# Patient Record
Sex: Female | Born: 1995 | Hispanic: Yes | Marital: Single | Smoking: Never smoker
Health system: Southern US, Community
[De-identification: ages and names within clinical notes are randomized; demographics above are authoritative.]

## PROBLEM LIST (undated history)

## (undated) DIAGNOSIS — R079 Chest pain, unspecified: Secondary | ICD-10-CM

## (undated) DIAGNOSIS — R0602 Shortness of breath: Secondary | ICD-10-CM

## (undated) DIAGNOSIS — N159 Renal tubulo-interstitial disease, unspecified: Secondary | ICD-10-CM

## (undated) DIAGNOSIS — R519 Headache, unspecified: Secondary | ICD-10-CM

## (undated) DIAGNOSIS — R000 Tachycardia, unspecified: Secondary | ICD-10-CM

## (undated) DIAGNOSIS — O24419 Gestational diabetes mellitus in pregnancy, unspecified control: Secondary | ICD-10-CM

## (undated) HISTORY — DX: Shortness of breath: R06.02

## (undated) HISTORY — DX: Headache, unspecified: R51.9

## (undated) HISTORY — DX: Tachycardia, unspecified: R00.0

## (undated) HISTORY — PX: NO PAST SURGERIES: SHX2092

## (undated) HISTORY — DX: Chest pain, unspecified: R07.9

---
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
## 2018-02-04 ENCOUNTER — Encounter (HOSPITAL_COMMUNITY): Payer: Self-pay | Admitting: Emergency Medicine

## 2018-02-04 ENCOUNTER — Other Ambulatory Visit: Payer: Self-pay

## 2018-02-04 ENCOUNTER — Emergency Department (HOSPITAL_COMMUNITY): Payer: Self-pay

## 2018-02-04 ENCOUNTER — Emergency Department (HOSPITAL_COMMUNITY)
Admission: EM | Admit: 2018-02-04 | Discharge: 2018-02-04 | Disposition: A | Discharge status: Home / Self Care | Payer: Self-pay | Attending: Emergency Medicine | Admitting: Emergency Medicine

## 2018-02-04 DIAGNOSIS — M25461 Effusion, right knee: Secondary | ICD-10-CM | POA: Insufficient documentation

## 2018-02-04 DIAGNOSIS — M25469 Effusion, unspecified knee: Secondary | ICD-10-CM

## 2018-02-04 MED ORDER — CYCLOBENZAPRINE HCL 10 MG PO TABS: 10.0000 mg | ORAL_TABLET | Freq: Two times a day (BID) | ORAL | 0 refills | Status: DC | PRN

## 2018-02-04 MED ORDER — IBUPROFEN 800 MG PO TABS
800.0000 mg | ORAL_TABLET | Freq: Once | ORAL | Status: AC
  Administered 2018-02-04: 800 mg via ORAL
  Filled 2018-02-04: qty 1

## 2018-02-04 MED ORDER — IBUPROFEN 600 MG PO TABS: 600.0000 mg | ORAL_TABLET | Freq: Four times a day (QID) | ORAL | 0 refills | Status: DC | PRN

## 2018-02-04 NOTE — ED Triage Notes (Signed)
Pt to ER for evaluation of left hip and femur pain after mechanical fall in her yard, reports can bear weight but it is painful. No deformity noted. Pt speaks spanish, interpreter utilized.

## 2018-02-04 NOTE — Discharge Instructions (Signed)
Please call and follow up with orthopedist for further management of your right knee injury.

## 2018-02-04 NOTE — ED Provider Notes (Signed)
[facility] EMERGENCY DEPARTMENT Provider Note   CSN: [id number] Arrival date & time: 02/04/18  1112     History   Chief Complaint Chief Complaint  Patient presents with  . Foot Pain    HPI Virginia Pearson is a 22 y.o. female.  The history is provided by the patient. No language interpreter was used.     22 year old female presenting complaining of right leg injury.  Patient states yesterday she slipped on wet ground and fell down.  She states she fell to the right side of her body and twisted her right leg.  She is now having pain throughout the leg most significant in her right knee radiates to her right thigh.  Pain is sharp throbbing persistent worsening with movement and of moderate to severe in intensity.  She is having difficulty bearing weight on her right leg.  No complaints of back pain ankle or foot pain.  She is not pregnant.  History reviewed. No pertinent past medical history.  There are no active problems to display for this patient.   History reviewed. No pertinent surgical history.   OB History   None      Home Medications    Prior to Admission medications   Not on File    Family History History reviewed. No pertinent family history.  Social History Social History   Tobacco Use  . Smoking status: Never Smoker  . Smokeless tobacco: Never Used  Substance Use Topics  . Alcohol use: No  . Drug use: No     Allergies   Patient has no known allergies.   Review of Systems Review of Systems  Constitutional: Negative for fever.  Skin: Negative for wound.  Neurological: Negative for numbness.     Physical Exam Updated Vital Signs BP 136/61 (BP Location: Left Arm)   Pulse 80   Temp 98.4 F (36.9 C) (Oral)   Resp 16   LMP 01/29/2018   SpO2 100%   Physical Exam  Constitutional: She appears well-developed and well-nourished. No distress.  HENT:  Head: Atraumatic.  Eyes: Conjunctivae are normal.  Neck: Neck  supple.  Abdominal: Soft. There is no tenderness.  Musculoskeletal: She exhibits tenderness (Right knee: Exquisite tenderness to the palpation of the anterior knee with decreased flexion extension but no obvious bruising swelling or crepitus noted.  Tenderness along right anterior mid thigh on palpation.  No tenderness to right hip or right ankle.).  Neurological: She is alert.  Skin: No rash noted.  Psychiatric: She has a normal mood and affect.  Nursing note and vitals reviewed.    ED Treatments / Results  Labs (all labs ordered are listed, but only abnormal results are displayed) Labs Reviewed - No data to display  EKG None  Radiology Dg Knee Complete 4 Views Right  Result Date: 02/04/2018 CLINICAL DATA:  Mechanical fall in the yard. Painful to bear weight. Symptoms are centered just above the patella. EXAM: RIGHT KNEE - COMPLETE 4+ VIEW COMPARISON:  None. FINDINGS: The bones are subjectively adequately mineralized. There is a moderate-sized suprapatellar effusion. There is no acute fracture or dislocation. The joint spaces are well maintained. IMPRESSION: Moderate-sized suprapatellar effusion. No acute fracture or dislocation. Electronically Signed   By: David  [place] M.D.   On: 02/04/2018 12:39    Procedures Procedures (including critical care time)  Medications Ordered in ED Medications  ibuprofen (ADVIL,MOTRIN) tablet 800 mg (800 mg Oral Given 02/04/18 1257)     Initial Impression /  Assessment and Plan / ED Course  I have reviewed the triage vital signs and the nursing notes.  Pertinent labs & imaging results that were available during my care of the patient were reviewed by me and considered in my medical decision making (see chart for details).     BP 136/61 (BP Location: Left Arm)   Pulse 80   Temp 98.4 F (36.9 C) (Oral)   Resp 16   LMP 01/29/2018   SpO2 100%    Final Clinical Impressions(s) / ED Diagnoses   Final diagnoses:  Suprapatellar effusion of  knee    ED Discharge Orders         Ordered    ibuprofen (ADVIL,MOTRIN) 600 MG tablet  Every 6 hours PRN     02/04/18 1315    cyclobenzaprine (FLEXERIL) 10 MG tablet  2 times daily PRN     02/04/18 1315         12:03 PM Patient fell yesterday and twisted her right leg.  Pain was significant to her right knee on gentle palpation.  Will obtain x-ray, pain medication given.  1:13 PM X-ray of right knee demonstrate moderate sized suprapatellar effusion without any acute fracture or dislocation.  Patient will be placed in a knee immobilizer and crutches.  Rice therapy discussed.  Orthopedic referral given.  Work note provided.  She is neurovascularly intact.  Return precautions discussed.   Fayrene Helper, PA-C 02/04/18 1317    Melene Plan, DO 02/04/18 1348

## 2018-05-08 NOTE — L&D Delivery Note (Signed)
OB/[facility]N Faculty [facility] Delivery Note  Virginia Pearson is a 23 y.o. G2P1001 s/p NSVD at [redacted]w[redacted]d. She was admitted for spontaneous labor.   ROM: 1h 26m with meconium fluid GBS Status: Negative   Maximum Maternal Temperature: 98.9 F   Labor Progress: . Patient arrived at 6 cm dilation and progressed spontaneously to full dilation.   Delivery Date/Time: 02/26/2019 at 0521 Delivery: Called to room and patient was complete and pushing. Head delivered in LOA position. No nuchal cord present. Shoulder and body delivered in usual fashion. Infant with weak spontaneous cry, placed on mother's abdomen, dried and stimulated. Cord clamped x 2 after 1-minute delay, and cut by father. Taken to warmer for several minutes but then returned to maternal abdomen. Cord blood drawn. Placenta delivered spontaneously with gentle cord traction. Fundus firm with massage and Pitocin. Labia, perineum, vagina, and cervix inspected with no lacerations.   Placenta: 3v, intact, to L&D Complications: none Lacerations: none EBL: 24 cc Analgesia: none   Infant: APGAR (1 MIN): 6   APGAR (5 MINS): 9    Weight: 3566 grams  Augustin Coupe, MD/[facility] Fellow, Faculty Practice

## 2018-05-09 ENCOUNTER — Emergency Department (HOSPITAL_COMMUNITY)
Admission: EM | Admit: 2018-05-09 | Discharge: 2018-05-09 | Disposition: A | Discharge status: Home / Self Care | Payer: Self-pay | Attending: Emergency Medicine | Admitting: Emergency Medicine

## 2018-05-09 ENCOUNTER — Emergency Department (HOSPITAL_COMMUNITY): Payer: Self-pay

## 2018-05-09 ENCOUNTER — Other Ambulatory Visit: Payer: Self-pay

## 2018-05-09 ENCOUNTER — Encounter (HOSPITAL_COMMUNITY): Payer: Self-pay | Admitting: Emergency Medicine

## 2018-05-09 DIAGNOSIS — Z79899 Other long term (current) drug therapy: Secondary | ICD-10-CM | POA: Insufficient documentation

## 2018-05-09 DIAGNOSIS — M545 Low back pain, unspecified: Secondary | ICD-10-CM

## 2018-05-09 LAB — URINALYSIS, ROUTINE W REFLEX MICROSCOPIC
Bilirubin Urine: NEGATIVE
Glucose, UA: NEGATIVE mg/dL
HGB URINE DIPSTICK: NEGATIVE
Ketones, ur: NEGATIVE mg/dL
Nitrite: NEGATIVE
PROTEIN: NEGATIVE mg/dL
Specific Gravity, Urine: 1.014 (ref 1.005–1.030)
pH: 6 (ref 5.0–8.0)

## 2018-05-09 LAB — PREGNANCY, URINE: PREG TEST UR: NEGATIVE

## 2018-05-09 MED ORDER — CYCLOBENZAPRINE HCL 10 MG PO TABS: 10.0000 mg | ORAL_TABLET | Freq: Two times a day (BID) | ORAL | 0 refills | Status: DC | PRN

## 2018-05-09 NOTE — Discharge Instructions (Signed)
1. Medications: Alternate 600 mg of ibuprofen and [phone] mg of Tylenol every 3 hours as needed for pain. Do not exceed 4000 mg of Tylenol daily.  Take ibuprofen with food to avoid upset stomach issues.  You can take Flexeril as needed for muscle spasm up to twice daily but do not drive, drink alcohol, or operate heavy machinery while taking this medicine because it may make you drowsy.  I typically recommend taking this medicine only at night when you are going to sleep.  He can also cut these tablets in half if they make you feel very drowsy. 2. Treatment: rest, drink plenty of fluids, gentle stretching as discussed (see attached), alternate ice and heat (or stick with whichever feels best) 20 minutes on 20 minutes off. 3. Follow Up: Please followup with your primary doctor in 3-7 days for discussion of your diagnoses and further evaluation after today's visit; if you do not have a primary care doctor use the resource guide provided to find one;  Return to the ER for worsening back pain, difficulty walking, loss of bowel or bladder control or other concerning symptoms

## 2018-05-09 NOTE — ED Triage Notes (Signed)
Pt reports flank pain and right sided abdominal pain x 2 weeks, it is intermittent, denies any urinary symptoms.

## 2018-05-09 NOTE — ED Notes (Signed)
Patient transported to CT 

## 2018-05-09 NOTE — ED Notes (Signed)
Pt aware that a urine sample is needed and will provide one when able. 

## 2018-05-09 NOTE — ED Provider Notes (Signed)
[facility] EMERGENCY DEPARTMENT Provider Note   CSN: [id number] Arrival date & time: 05/09/18  1844     History   Chief Complaint Chief Complaint  Patient presents with  . Flank Pain    HPI Virginia Pearson is a 23 y.o. female presents for evaluation of acute onset, intermittent right flank pain for 2 weeks.  She notes stabbing pain to the right flank intermittently which will radiate down as well as urinary frequency.  She denies dysuria, hematuria, nausea, vomiting, or fevers.  She has not tried anything for her symptoms.  She states that she is prone to "kidney infections "due to "an abnormally shaped kidney ".  Denies history of nephrolithiasis.  Pain worsens bending forward or backwards.  Reports her symptoms feel very consistent with prior episodes of pyelonephritis.  Denies trauma or fall to the back.  No bowel or bladder incontinence, saddle anesthesia, or history of IV drug use.  Denies vaginal itching, bleeding, or discharge.   The history is provided by the patient.    History reviewed. No pertinent past medical history.  There are no active problems to display for this patient.   History reviewed. No pertinent surgical history.   OB History   No obstetric history on file.      Home Medications    Prior to Admission medications   Medication Sig Start Date End Date Taking? Authorizing Provider  cyclobenzaprine (FLEXERIL) 10 MG tablet Take 1 tablet (10 mg total) by mouth 2 (two) times daily as needed for muscle spasms. 05/09/18   Galaxy Borden A, PA-C  ibuprofen (ADVIL,MOTRIN) 600 MG tablet Take 1 tablet (600 mg total) by mouth every 6 (six) hours as needed. 02/04/18   Fayrene Helper, PA-C    Family History History reviewed. No pertinent family history.  Social History Social History   Tobacco Use  . Smoking status: Never Smoker  . Smokeless tobacco: Never Used  Substance Use Topics  . Alcohol use: No  . Drug use: No     Allergies     Patient has no known allergies.   Review of Systems Review of Systems  Constitutional: Negative for chills and fever.  Respiratory: Negative for shortness of breath.   Cardiovascular: Negative for chest pain.  Gastrointestinal: Negative for abdominal pain, nausea and vomiting.  Genitourinary: Positive for flank pain and frequency. Negative for dysuria, hematuria and urgency.  Musculoskeletal: Positive for back pain.  All other systems reviewed and are negative.    Physical Exam Updated Vital Signs BP (!) 142/91 (BP Location: Right Arm)   Pulse (!) 104   Temp 98.3 F (36.8 C) (Oral)   Resp 16   Ht 5' (1.524 m)   Wt 81.6 kg   LMP 04/30/2018 (Exact Date)   SpO2 100%   BMI 35.15 kg/m   Physical Exam Vitals signs and nursing note reviewed.  Constitutional:      General: She is not in acute distress.    Appearance: She is well-developed.     Comments: Resting comfortably in bed  HENT:     Head: Normocephalic and atraumatic.  Eyes:     General:        Right eye: No discharge.        Left eye: No discharge.     Conjunctiva/sclera: Conjunctivae normal.  Neck:     Vascular: No JVD.     Trachea: No tracheal deviation.  Cardiovascular:     Rate and Rhythm: Normal rate.  Pulmonary:  Effort: Pulmonary effort is normal.     Breath sounds: Normal breath sounds.  Abdominal:     General: Bowel sounds are normal. There is no distension.     Tenderness: There is no abdominal tenderness. There is right CVA tenderness. There is no guarding.  Musculoskeletal:     Comments: No midline spine TTP, right upper paralumbar muscle tenderness, no deformity, crepitus, or step-off noted.  5/5 strength of BLE major muscle groups.   Skin:    General: Skin is warm and dry.     Findings: No erythema.  Neurological:     Mental Status: She is alert.     Comments: Fluent speech, no facial droop, sensation intact to soft touch of bilateral lower extremities.  Ambulatory without difficulty,  exhibits good balance.  Psychiatric:        Behavior: Behavior normal.      ED Treatments / Results  Labs (all labs ordered are listed, but only abnormal results are displayed) Labs Reviewed  URINALYSIS, ROUTINE W REFLEX MICROSCOPIC - Abnormal; Notable for the following components:      Result Value   Leukocytes, UA TRACE (*)    Bacteria, UA RARE (*)    All other components within normal limits  PREGNANCY, URINE    EKG None  Radiology Ct Renal Stone Study  Result Date: 05/09/2018 CLINICAL DATA:  Left-sided back and flank pain EXAM: CT ABDOMEN AND PELVIS WITHOUT CONTRAST TECHNIQUE: Multidetector CT imaging of the abdomen and pelvis was performed following the standard protocol without IV contrast. COMPARISON:  None. FINDINGS: Lower chest: Lung bases demonstrate no acute consolidation or effusion. Heart size is normal Hepatobiliary: No focal liver abnormality is seen. No gallstones, gallbladder wall thickening, or biliary dilatation. Pancreas: Unremarkable. No pancreatic ductal dilatation or surrounding inflammatory changes. Spleen: Normal in size without focal abnormality. Adrenals/Urinary Tract: Adrenal glands are within normal limits. Horseshoe kidney. Possible 1 cm cyst in the left moiety. No hydronephrosis. No ureteral stone. Bladder normal Stomach/Bowel: Stomach is within normal limits. Appendix appears normal. No evidence of bowel wall thickening, distention, or inflammatory changes. Vascular/Lymphatic: No significant vascular findings are present. No enlarged abdominal or pelvic lymph nodes. Reproductive: Uterus and bilateral adnexa are unremarkable. Other: Negative for free air or free fluid Musculoskeletal: No acute or significant osseous findings. IMPRESSION: 1. Horseshoe kidney.  Negative for hydronephrosis or ureteral stone 2. No CT evidence for acute intra-abdominal or pelvic abnormality Electronically Signed   By: Jasmine PangKim  Fujinaga M.D.   On: 05/09/2018 22:15     Procedures Procedures (including critical care time)  Medications Ordered in ED Medications - No data to display   Initial Impression / Assessment and Plan / ED Course  I have reviewed the triage vital signs and the nursing notes.  Pertinent labs & imaging results that were available during my care of the patient were reviewed by me and considered in my medical decision making (see chart for details).     Patient presenting for evaluation of intermittent right low back pain.  Reports it feels similar to prior episodes of pyelonephritis.  She is afebrile, initially mildly tachycardic with improvement on reevaluation.  She is nontoxic in appearance. Pain reproducible on palpation. Her only urinary symptom is frequency.  Her UA is unremarkable.  No red flag signs concerning for cauda equina or spinal abscess.  Doubt dissection.  Ambulatory without difficulty.  CT renal stone study shows no evidence of nephrolithiasis, pyelonephritis, or renal abscess.  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  Will treat for possible musculoskeletal back pain with Flexeril, conservative therapy with ice, heat, gentle stretching, NSAIDs, and Tylenol.  Recommend follow-up with PCP if symptoms persist.  Discussed strict ED return precautions.  Patient and friend verbalized understanding of and agreement with plan and patient stable for discharge home at this time.  Final Clinical Impressions(s) / ED Diagnoses   Final diagnoses:  Acute right-sided low back pain without sciatica    ED Discharge Orders         Ordered    cyclobenzaprine (FLEXERIL) 10 MG tablet  2 times daily PRN     05/09/18 2235           Jeanie SewerFawze, Jelina Paulsen A, PA-C 05/09/18 2320    Rolan BuccoBelfi, Melanie, MD 05/09/18 2326

## 2018-05-09 NOTE — ED Notes (Signed)
Discharge instructions and prescriptions discussed with Pt. Pt verbalized understanding. Pt stable and ambulatory.   

## 2018-08-26 LAB — OB RESULTS CONSOLE HEPATITIS B SURFACE ANTIGEN: Hepatitis B Surface Ag: NEGATIVE

## 2018-08-26 LAB — OB RESULTS CONSOLE GC/CHLAMYDIA
Chlamydia: NEGATIVE
Gonorrhea: NEGATIVE

## 2018-08-26 LAB — OB RESULTS CONSOLE ABO/RH: RH Type: POSITIVE

## 2018-08-26 LAB — OB RESULTS CONSOLE HIV ANTIBODY (ROUTINE TESTING): HIV: NONREACTIVE

## 2018-08-26 LAB — OB RESULTS CONSOLE RUBELLA ANTIBODY, IGM: Rubella: IMMUNE

## 2019-02-26 ENCOUNTER — Inpatient Hospital Stay (HOSPITAL_COMMUNITY)
Admission: AD | Admit: 2019-02-26 | Discharge: 2019-02-27 | DRG: 807 | Disposition: A | Discharge status: Home / Self Care | Payer: Medicaid Other | Attending: Family Medicine | Admitting: Family Medicine

## 2019-02-26 ENCOUNTER — Other Ambulatory Visit: Payer: Self-pay

## 2019-02-26 ENCOUNTER — Encounter (HOSPITAL_COMMUNITY): Payer: Self-pay

## 2019-02-26 DIAGNOSIS — Z3A38 38 weeks gestation of pregnancy: Secondary | ICD-10-CM

## 2019-02-26 DIAGNOSIS — D573 Sickle-cell trait: Secondary | ICD-10-CM | POA: Diagnosis present

## 2019-02-26 DIAGNOSIS — Z3689 Encounter for other specified antenatal screening: Secondary | ICD-10-CM

## 2019-02-26 DIAGNOSIS — Z20828 Contact with and (suspected) exposure to other viral communicable diseases: Secondary | ICD-10-CM | POA: Diagnosis present

## 2019-02-26 DIAGNOSIS — O26893 Other specified pregnancy related conditions, third trimester: Secondary | ICD-10-CM | POA: Diagnosis present

## 2019-02-26 DIAGNOSIS — O9902 Anemia complicating childbirth: Secondary | ICD-10-CM | POA: Diagnosis present

## 2019-02-26 LAB — CBC
HCT: 34.7 % — ABNORMAL LOW (ref 36.0–46.0)
Hemoglobin: 11.7 g/dL — ABNORMAL LOW (ref 12.0–15.0)
MCH: 25.2 pg — ABNORMAL LOW (ref 26.0–34.0)
MCHC: 33.7 g/dL (ref 30.0–36.0)
MCV: 74.6 fL — ABNORMAL LOW (ref 80.0–100.0)
Platelets: 305 10*3/uL (ref 150–400)
RBC: 4.65 MIL/uL (ref 3.87–5.11)
RDW: 16.2 % — ABNORMAL HIGH (ref 11.5–15.5)
WBC: 11.1 10*3/uL — ABNORMAL HIGH (ref 4.0–10.5)
nRBC: 0 % (ref 0.0–0.2)

## 2019-02-26 LAB — TYPE AND SCREEN
ABO/RH(D): B POS
Antibody Screen: NEGATIVE

## 2019-02-26 LAB — RPR: RPR Ser Ql: NONREACTIVE

## 2019-02-26 LAB — ABO/RH: ABO/RH(D): B POS

## 2019-02-26 LAB — SARS CORONAVIRUS 2 BY RT PCR (HOSPITAL ORDER, PERFORMED IN ~~LOC~~ HOSPITAL LAB): SARS Coronavirus 2: NEGATIVE

## 2019-02-26 MED ORDER — LACTATED RINGERS IV SOLN
INTRAVENOUS | Status: DC
  Administered 2019-02-26: 05:00:00 via INTRAVENOUS

## 2019-02-26 MED ORDER — ACETAMINOPHEN 325 MG PO TABS: 650.0000 mg | ORAL_TABLET | ORAL | Status: DC | PRN

## 2019-02-26 MED ORDER — FLEET ENEMA 7-19 GM/118ML RE ENEM: 1.0000 | ENEMA | RECTAL | Status: DC | PRN

## 2019-02-26 MED ORDER — OXYTOCIN BOLUS FROM INFUSION
500.0000 mL | Freq: Once | INTRAVENOUS | Status: AC
  Administered 2019-02-26: 500 mL via INTRAVENOUS

## 2019-02-26 MED ORDER — DIPHENHYDRAMINE HCL 25 MG PO CAPS: 25.0000 mg | ORAL_CAPSULE | Freq: Four times a day (QID) | ORAL | Status: DC | PRN

## 2019-02-26 MED ORDER — ONDANSETRON HCL 4 MG PO TABS: 4.0000 mg | ORAL_TABLET | ORAL | Status: DC | PRN

## 2019-02-26 MED ORDER — BENZOCAINE-MENTHOL 20-0.5 % EX AERO: 1.0000 "application " | INHALATION_SPRAY | CUTANEOUS | Status: DC | PRN

## 2019-02-26 MED ORDER — COCONUT OIL OIL
1.0000 "application " | TOPICAL_OIL | Status: DC | PRN
  Administered 2019-02-27: 1 via TOPICAL

## 2019-02-26 MED ORDER — FENTANYL CITRATE (PF) 100 MCG/2ML IJ SOLN
100.0000 ug | Freq: Once | INTRAMUSCULAR | Status: AC
  Administered 2019-02-26: 50 ug via INTRAVENOUS

## 2019-02-26 MED ORDER — ACETAMINOPHEN 325 MG PO TABS
650.0000 mg | ORAL_TABLET | ORAL | Status: DC | PRN
  Administered 2019-02-26: 650 mg via ORAL
  Filled 2019-02-26: qty 2

## 2019-02-26 MED ORDER — ONDANSETRON HCL 4 MG/2ML IJ SOLN: 4.0000 mg | INTRAMUSCULAR | Status: DC | PRN

## 2019-02-26 MED ORDER — LIDOCAINE HCL (PF) 1 % IJ SOLN: 30.0000 mL | INTRAMUSCULAR | Status: DC | PRN

## 2019-02-26 MED ORDER — LACTATED RINGERS IV SOLN
500.0000 mL | INTRAVENOUS | Status: DC | PRN
  Administered 2019-02-26: 500 mL via INTRAVENOUS

## 2019-02-26 MED ORDER — SENNOSIDES-DOCUSATE SODIUM 8.6-50 MG PO TABS
2.0000 | ORAL_TABLET | ORAL | Status: DC
  Administered 2019-02-26: 23:00:00 2 via ORAL
  Filled 2019-02-26: qty 2

## 2019-02-26 MED ORDER — OXYTOCIN 40 UNITS IN NORMAL SALINE INFUSION - SIMPLE MED
2.5000 [IU]/h | INTRAVENOUS | Status: DC
  Filled 2019-02-26: qty 1000

## 2019-02-26 MED ORDER — DIBUCAINE (PERIANAL) 1 % EX OINT: 1.0000 "application " | TOPICAL_OINTMENT | CUTANEOUS | Status: DC | PRN

## 2019-02-26 MED ORDER — OXYCODONE HCL 5 MG PO TABS: 10.0000 mg | ORAL_TABLET | ORAL | Status: DC | PRN

## 2019-02-26 MED ORDER — OXYCODONE-ACETAMINOPHEN 5-325 MG PO TABS
1.0000 | ORAL_TABLET | ORAL | Status: DC | PRN
  Filled 2019-02-26: qty 1

## 2019-02-26 MED ORDER — ONDANSETRON HCL 4 MG/2ML IJ SOLN: 4.0000 mg | Freq: Four times a day (QID) | INTRAMUSCULAR | Status: DC | PRN

## 2019-02-26 MED ORDER — WITCH HAZEL-GLYCERIN EX PADS: 1.0000 "application " | MEDICATED_PAD | CUTANEOUS | Status: DC | PRN

## 2019-02-26 MED ORDER — OXYCODONE-ACETAMINOPHEN 5-325 MG PO TABS
2.0000 | ORAL_TABLET | ORAL | Status: DC | PRN
  Administered 2019-02-26: 1 via ORAL

## 2019-02-26 MED ORDER — MAGNESIUM HYDROXIDE 400 MG/5ML PO SUSP: 30.0000 mL | ORAL | Status: DC | PRN

## 2019-02-26 MED ORDER — IBUPROFEN 600 MG PO TABS
600.0000 mg | ORAL_TABLET | Freq: Four times a day (QID) | ORAL | Status: DC
  Administered 2019-02-26 – 2019-02-27 (×5): 600 mg via ORAL
  Filled 2019-02-26 (×5): qty 1

## 2019-02-26 MED ORDER — SOD CITRATE-CITRIC ACID 500-334 MG/5ML PO SOLN: 30.0000 mL | ORAL | Status: DC | PRN

## 2019-02-26 MED ORDER — FENTANYL CITRATE (PF) 100 MCG/2ML IJ SOLN
INTRAMUSCULAR | Status: AC
  Filled 2019-02-26: qty 2

## 2019-02-26 MED ORDER — PRENATAL MULTIVITAMIN CH
1.0000 | ORAL_TABLET | Freq: Every day | ORAL | Status: DC
  Administered 2019-02-26 – 2019-02-27 (×2): 1 via ORAL
  Filled 2019-02-26 (×2): qty 1

## 2019-02-26 MED ORDER — OXYCODONE HCL 5 MG PO TABS: 5.0000 mg | ORAL_TABLET | ORAL | Status: DC | PRN

## 2019-02-26 MED ORDER — SIMETHICONE 80 MG PO CHEW: 80.0000 mg | CHEWABLE_TABLET | ORAL | Status: DC | PRN

## 2019-02-26 NOTE — Discharge Summary (Signed)
Postpartum Discharge Summary    Patient Name: Virginia Pearson DOB: 04-05-1996 MRN: [id number]  Date of admission: 02/26/2019 Delivering Provider: Clarnce Flock   Date of discharge: 02/27/2019  Admitting diagnosis: 63.4WKS CTX Intrauterine pregnancy: [redacted]w[redacted]d    Secondary diagnosis:  Active Problems:   Normal labor  Additional problems:      Discharge diagnosis: Term Pregnancy Delivered                                                                                                Post partum procedures:None  Augmentation: None  Complications: None  Hospital course:  Onset of Labor With Vaginal Delivery     23y.o. yo G2P1001 at 370w1das admitted in Active Labor on 02/26/2019. Patient had an uncomplicated labor course as follows: progressed spontaneously to full dilation. Membrane Rupture Time/Date: 4:20 AM ,02/26/2019   Intrapartum Procedures: Episiotomy: None [1]                                         Lacerations:  None [1]  Patient had a delivery of a Viable infant. 02/26/2019  Information for the patient's newborn:  CoShatasha, Lambing0[[id number]]Delivery Method: Vag-Spont     Patient had an uncomplicated postpartum course.   During her precipitous labor she had two elevated BP's 40 minutes apart and subsequently had normal BP's. She is ambulating, tolerating a regular diet, passing flatus, and urinating well. Patient is discharged home in stable condition on 02/27/19.  Delivery time: 5:21 AM    Magnesium Sulfate received: No BMZ received: No Rhophylac:N/A MMR:N/A Transfusion:No  Physical exam  Vitals:   02/26/19 1345 02/26/19 1730 02/26/19 2013 02/27/19 0503  BP: 123/73 123/71 128/61 (!) 98/53  Pulse: 72 70 64 69  Resp: 18 16 18 17   Temp: 98.4 F (36.9 C) 99 F (37.2 C) 98.6 F (37 C) 98.2 F (36.8 C)  TempSrc: Oral Oral Oral Oral  SpO2: 100% 100% 100% 100%  Weight:      Height:       General: alert, cooperative and no  distress Lochia: appropriate Uterine Fundus: firm Incision: N/A DVT Evaluation: No evidence of DVT seen on physical exam. No significant calf/ankle edema. Labs: Lab Results  Component Value Date   WBC 11.1 (H) 02/26/2019   HGB 11.7 (L) 02/26/2019   HCT 34.7 (L) 02/26/2019   MCV 74.6 (L) 02/26/2019   PLT 305 02/26/2019   CMP Latest Ref Rng & Units 01/02/2013  Glucose 70 - 99 mg/dL 116(H)  BUN 6 - 23 mg/dL 11  Creatinine 0.47 - 1.00 mg/dL 0.60  Sodium 135 - 145 mEq/L 136  Potassium 3.5 - 5.1 mEq/L 3.8  Chloride 96 - 112 mEq/L 100  CO2 19 - 32 mEq/L 22  Calcium 8.4 - 10.5 mg/dL 10.1  Total Protein 6.0 - 8.3 g/dL 8.8(H)  Total Bilirubin 0.3 - 1.2 mg/dL 0.7  Alkaline Phos 50 - 162 U/L 170(H)  AST 0 - 37 U/L 17  ALT 0 -  35 U/L 12    Discharge instruction: per After Visit Summary and "Baby and Me Booklet".  After visit meds:  Allergies as of 02/27/2019   No Known Allergies     Medication List    STOP taking these medications   cyclobenzaprine 10 MG tablet Commonly known as: FLEXERIL     TAKE these medications   acetaminophen 500 MG tablet Commonly known as: TYLENOL Take 500 mg by mouth every 6 (six) hours as needed for mild pain.   ibuprofen 600 MG tablet Commonly known as: ADVIL Take 1 tablet (600 mg total) by mouth every 6 (six) hours as needed.   PNV Prenatal Plus Multivitamin 27-1 MG Tabs Take 1 tablet by mouth daily.       Diet: routine diet  Activity: Advance as tolerated. Pelvic rest for 6 weeks.   Outpatient follow up:6 weeks Follow up Appt:No future appointments. Follow up Visit:   Please schedule this patient for Postpartum visit in: 6 weeks with the following provider: Any provider For C/S patients schedule nurse incision check in weeks 2 weeks: no Low risk pregnancy complicated by: n/a Delivery mode:  SVD Anticipated Birth Control:  OCPs PP Procedures needed: none  Schedule Integrated BH visit: no      Newborn Data: Live born female   Birth Weight:  3566 grams APGAR: 73, 9  Newborn Delivery   Birth date/time: 02/26/2019 05:21:00 Delivery type: Vaginal, Spontaneous      Baby Feeding: Breast Disposition:home with mother   02/27/2019 Clarnce Flock, MD

## 2019-02-26 NOTE — Lactation Note (Signed)
This note was copied from a baby's chart. Lactation Consultation Note  Patient Name: Boy Julina Altmann [id number] Date: 02/26/2019 Reason for consult: Initial assessment;Early term 37-38.6wks  LC in to visit with P2 Mom of ET infant at 72 hrs old.  Mom encouraged to keep baby STS as much as possible.  Mom got up to use the bathroom, large meconium stool changed and placed baby STS on Mom's chest.    Mom desires to breastfeed longer than she did with first baby (7 months).  Mom states baby latched on after birth and fed well for 30 mins.  Normal newborn sleepiness reviewed.   Reviewed breast massage and hand expression.   Encouraged Mom to offer breast with any cues.  Encouraged Mom to ask for help prn.  Lactation brochure left in room.  Mom aware of IP and OP lactation support available to her.    Consult Status Consult Status: Follow-up Date: 02/27/19 Follow-up type: In-patient    Broadus John 02/26/2019, 12:43 PM

## 2019-02-26 NOTE — MAU Note (Signed)
Contractions often. Was 1 cm at 36 week check-up.  Started leaking in the car but not sure if I'm just peeing.  No bleeding. Last labor was 3 hours.

## 2019-02-26 NOTE — Progress Notes (Signed)
Patient bit left arm/writst while pushing/ delivering. Left a mark noted by RN and MD.   Virginia Mire, RN

## 2019-02-26 NOTE — H&P (Signed)
LABOR AND DELIVERY ADMISSION HISTORY AND PHYSICAL NOTE  Virginia Pearson is a 23 y.o. female G2P1001 with IUP at [redacted]w[redacted]d by 15 wk [place] not c/w LMP presenting for spontaneous labor.   Arrived to MAU 6cm and then had SROM with meconium during transport to labor and delivery.  On arrival she reports positive fetal movement. She endorses leakage of fluid, denies VB.  She plans on breast feeding. She requests OCP's for birth control.  Reports last birth was NSVD, no complications.   Prenatal History/Complications: PNC at [place]:  @[redacted]w[redacted]d , CWD, normal anatomy, cephalic presentation, 21%HYQ, EFW 299 grams  Pregnancy complications:  - sickle cell trait - L kidney smaller, decreased function? Per [facility] records  Past Medical History: No past medical history on file.  Past Surgical History: No past surgical history on file.  Obstetrical History: OB History    Gravida  2   Para  1   Term  1   Preterm      AB      Living  1     SAB      TAB      Ectopic      Multiple      Live Births  1           Social History: Social History   Socioeconomic History  . Marital status: Single    Spouse name: Not on file  . Number of children: Not on file  . Years of education: Not on file  . Highest education level: Not on file  Occupational History  . Not on file  Social Needs  . Financial resource strain: Not on file  . Food insecurity    Worry: Not on file    Inability: Not on file  . Transportation needs    Medical: Not on file    Non-medical: Not on file  Tobacco Use  . Smoking status: Never Smoker  . Smokeless tobacco: Never Used  Substance and Sexual Activity  . Alcohol use: No  . Drug use: No  . Sexual activity: Never  Lifestyle  . Physical activity    Days per week: Not on file    Minutes per session: Not on file  . Stress: Not on file  Relationships  . Social Herbalist on phone: Not on file    Gets together: Not on file    Attends  religious service: Not on file    Active member of club or organization: Not on file    Attends meetings of clubs or organizations: Not on file    Relationship status: Not on file  Other Topics Concern  . Not on file  Social History Narrative  . Not on file    Family History: No family history on file.  Allergies: No Known Allergies  Medications Prior to Admission  Medication Sig Dispense Refill Last Dose  . cyclobenzaprine (FLEXERIL) 10 MG tablet Take 1 tablet (10 mg total) by mouth 2 (two) times daily as needed for muscle spasms. 10 tablet 0   . ibuprofen (ADVIL,MOTRIN) 600 MG tablet Take 1 tablet (600 mg total) by mouth every 6 (six) hours as needed. 30 tablet 0      Review of Systems  All systems reviewed and negative except as stated in HPI  Physical Exam Blood pressure (!) 150/90, pulse 93, temperature 98.9 F (37.2 C), temperature source Oral, resp. rate (!) 21, last menstrual period 04/30/2018. General appearance: alert, oriented, NAD Lungs: normal respiratory  effort Heart: regular rate Abdomen: soft, non-tender; gravid, leopolds 3600 g Extremities: No calf swelling or tenderness Presentation: cephalic by exam Fetal monitoringBaseline: 150 bpm, Variability: Good {> 6 bpm), Accelerations: Reactive and Decelerations: mix of early and variables Uterine activityFrequency: Every 2-4 minutes Dilation: 6 Effacement (%): 100 Station: 0 Exam by:: Crissie Reese, MD  Prenatal labs: ABO, Rh:   B+ Antibody:  Neg Rubella:   Immune RPR:   Neg HBsAg:   Neg HIV:   NR GC/Chlamydia: neg/neg 02/10/2019  GBS:   Neg 02/10/2019 2-hr GTT: normal initial and 3rd tri one hour Genetic screening:  Neg quad screen Anatomy US: normal  Prenatal Transfer Tool  Maternal Diabetes: No Genetic Screening: Normal Maternal Ultrasounds/Referrals: Normal Fetal Ultrasounds or other Referrals:  None Maternal Substance Abuse:  No Significant Maternal Medications:  None Significant Maternal Lab  Results: Group B Strep negative  No results found for this or any previous visit (from the past 24 hour(s)).  Patient Active Problem List   Diagnosis Date Noted  . Normal labor 02/26/2019    Assessment: Virginia Pearson is a 23 y.o. G2P1001 at [redacted]w[redacted]d here for spontaneous labor.  #Labor: Arrives at 6cm with meconium, adequate contraction pattern, expectant management for time being, augment with pitocin PRN.  #Pain: IV pain medicine PRN, declines epidural #FWB: Cat II for variables however overall reassuring with moderate variability and accels #GBS/ID:  negative #COVID: swab pending #MOF: Breast #MOC: OCP's #Circ:  no  Venora Maples 02/26/2019, 4:35 AM

## 2019-02-26 NOTE — MAU Provider Note (Signed)
Chief Complaint:  No chief complaint on file.      HPI: Virginia Pearson is a 23 y.o. G1P0 at 6w4dwho presents to maternity admissions reporting contractions. . She reports good fetal movement, denies LOF, vaginal bleeding, vaginal itching/burning, urinary symptoms, h/a, dizziness, n/v, diarrhea, constipation or fever/chills.     Past Medical History: No past medical history on file.  Past obstetric history: OB History  Gravida Para Term Preterm AB Living  1            SAB TAB Ectopic Multiple Live Births               # Outcome Date GA Lbr Len/2nd Weight Sex Delivery Anes PTL Lv  1 Current             Past Surgical History: No past surgical history on file.  Family History: No family history on file.  Social History: Social History   Tobacco Use  . Smoking status: Never Smoker  . Smokeless tobacco: Never Used  Substance Use Topics  . Alcohol use: No  . Drug use: No    Allergies: No Known Allergies  Meds:  Medications Prior to Admission  Medication Sig Dispense Refill Last Dose  . cyclobenzaprine (FLEXERIL) 10 MG tablet Take 1 tablet (10 mg total) by mouth 2 (two) times daily as needed for muscle spasms. 10 tablet 0   . ibuprofen (ADVIL,MOTRIN) 600 MG tablet Take 1 tablet (600 mg total) by mouth every 6 (six) hours as needed. 30 tablet 0     I have reviewed patient's Past Medical Hx, Surgical Hx, Family Hx, Social Hx, medications and allergies.   ROS:  Review of Systems  Gastrointestinal: Positive for abdominal pain.  Genitourinary: Positive for pelvic pain. Negative for vaginal bleeding.   Other systems negative  Physical Exam    Dilation: 6.5 Effacement (%): 100 Cervical Position: Middle Station: -2 Presentation: Vertex Exam by:: benji Production designer, theatre/television/film  FHT:  Baseline 140 , moderate variability, accelerations present, no decelerations Contractions: q 2 mins Irregular     Labs: No results found for this or any previous visit (from the past 24  hour(s)).    Imaging:  Pt informed that the ultrasound is considered a limited OB ultrasound and is not intended to be a complete ultrasound exam.  Patient also informed that the ultrasound is not being completed with the intent of assessing for fetal or placental anomalies or any pelvic abnormalities.  Explained that the purpose of today's ultrasound is to assess for presentation, BPP and amniotic fluid volume.  Patient acknowledges the purpose of the exam and the limitations of the study.    Vertex confirmed by bedside US  MAU Course/MDM:  Assessment:  Plan: Admit  Hansel Feinstein CNM, MSN Certified Nurse-Midwife 02/26/2019 4:15 AM

## 2019-02-26 NOTE — Progress Notes (Signed)
Spanish interpreter [phone] at bedside along with provider Dr. Iona Coach, RN

## 2019-02-27 NOTE — Discharge Instructions (Signed)

## 2019-02-27 NOTE — Progress Notes (Signed)
[employer], present for discharge teaching for mother and baby.

## 2019-02-27 NOTE — Lactation Note (Signed)
This note was copied from a baby's chart. Lactation Consultation Note  Patient Name: Virginia Pearson [id number] Date: 02/27/2019 Reason for consult: Follow-up assessment;Early term 37-38.6wks;Infant weight loss  28 hours old ETI who is being exclusively BF by his mother, she's a P2. Mom and baby are going home today, baby is at 6% weight loss. Reviewed discharge instructions, engorgement prevention/treatment, treatment/prevention of sore nipples and red flags on when to call baby's pediatrician.  Mom complained of sore nipples, but they looked intact upon examination. She told LC she's getting sore because baby was taking nipple in and out while feeding. Advised mom to work on a deep latch and offered assistance, she agreed to wake baby up to feed, it looks like he just started cluster feeding. Dad changed a wet diaper, documented in flowsheets.  LC took baby STS to mother's left breast in cross cradle position and he was able to latch almost immediately but reinforced to mom that she has to wait for him to open this mouth really wide and cover most of her areolar tissue when latching; she voiced understanding. Baby fed for 7 minutes with a few audible swallows noted with compressions before falling asleep.   [place] asked the front desk for coconut oil and mom will be using it along with her colostrum. She couldn't express more than a few needle size droplets, her breasts are very sensitive and LC couldn't finish helping her hand express because she kept complaining about "soreness" during compressions. Parents encouraged to keep putting baby to breast on cues, especially when cluster feeding. They reported all questions and concerns were answered, they're both aware of [facility] services and will contact PRN.  Maternal Data    Feeding Feeding Type: Breast Fed  LATCH Score Latch: Grasps breast easily, tongue down, lips flanged, rhythmical sucking.  Audible Swallowing: A few with  stimulation(with compressions)  Type of Nipple: Everted at rest and after stimulation  Comfort (Breast/Nipple): Filling, red/small blisters or bruises, mild/mod discomfort(intact, no trauma, but mild discomfort)  Hold (Positioning): Assistance needed to correctly position infant at breast and maintain latch.  LATCH Score: 7  Interventions Interventions: Breast feeding basics reviewed;Assisted with latch;Skin to skin;Breast massage;Hand express;Breast compression;Adjust position;Support pillows;Coconut oil  Lactation Tools Discussed/Used Tools: Coconut oil   Consult Status Consult Status: Complete Date: 02/27/19 Follow-up type: Call as needed    Volanda Mangine Francene Boyers 02/27/2019, 9:46 AM

## 2020-02-11 ENCOUNTER — Other Ambulatory Visit: Payer: Self-pay

## 2020-02-11 ENCOUNTER — Emergency Department (HOSPITAL_COMMUNITY)
Admission: EM | Admit: 2020-02-11 | Discharge: 2020-02-11 | Disposition: A | Discharge status: Home / Self Care | Payer: Medicaid Other | Attending: Emergency Medicine | Admitting: Emergency Medicine

## 2020-02-11 ENCOUNTER — Emergency Department (HOSPITAL_COMMUNITY): Payer: Medicaid Other

## 2020-02-11 DIAGNOSIS — R0602 Shortness of breath: Secondary | ICD-10-CM | POA: Insufficient documentation

## 2020-02-11 DIAGNOSIS — R079 Chest pain, unspecified: Secondary | ICD-10-CM | POA: Diagnosis present

## 2020-02-11 DIAGNOSIS — I479 Paroxysmal tachycardia, unspecified: Secondary | ICD-10-CM | POA: Diagnosis not present

## 2020-02-11 LAB — CBC WITH DIFFERENTIAL/PLATELET
Abs Immature Granulocytes: 0.02 10*3/uL (ref 0.00–0.07)
Basophils Absolute: 0.1 10*3/uL (ref 0.0–0.1)
Basophils Relative: 1 %
Eosinophils Absolute: 0.1 10*3/uL (ref 0.0–0.5)
Eosinophils Relative: 1 %
HCT: 35.5 % — ABNORMAL LOW (ref 36.0–46.0)
Hemoglobin: 11.6 g/dL — ABNORMAL LOW (ref 12.0–15.0)
Immature Granulocytes: 0 %
Lymphocytes Relative: 34 %
Lymphs Abs: 2.8 10*3/uL (ref 0.7–4.0)
MCH: 24.6 pg — ABNORMAL LOW (ref 26.0–34.0)
MCHC: 32.7 g/dL (ref 30.0–36.0)
MCV: 75.4 fL — ABNORMAL LOW (ref 80.0–100.0)
Monocytes Absolute: 0.4 10*3/uL (ref 0.1–1.0)
Monocytes Relative: 5 %
Neutro Abs: 4.9 10*3/uL (ref 1.7–7.7)
Neutrophils Relative %: 59 %
Platelets: 342 10*3/uL (ref 150–400)
RBC: 4.71 MIL/uL (ref 3.87–5.11)
RDW: 14.3 % (ref 11.5–15.5)
WBC: 8.3 10*3/uL (ref 4.0–10.5)
nRBC: 0 % (ref 0.0–0.2)

## 2020-02-11 LAB — BASIC METABOLIC PANEL
Anion gap: 11 (ref 5–15)
BUN: 8 mg/dL (ref 6–20)
CO2: 24 mmol/L (ref 22–32)
Calcium: 9 mg/dL (ref 8.9–10.3)
Chloride: 103 mmol/L (ref 98–111)
Creatinine, Ser: 0.59 mg/dL (ref 0.44–1.00)
GFR calc non Af Amer: >60 mL/min (ref >60)
Glucose, Bld: 116 mg/dL — ABNORMAL HIGH (ref 70–99)
Potassium: 3.7 mmol/L (ref 3.5–5.1)
Sodium: 138 mmol/L (ref 135–145)

## 2020-02-11 LAB — D-DIMER, QUANTITATIVE: D-Dimer, Quant: 0.62 ug/mL-FEU — ABNORMAL HIGH (ref 0.00–0.50)

## 2020-02-11 LAB — I-STAT BETA HCG BLOOD, ED (MC, WL, AP ONLY): I-stat hCG, quantitative: <5 m[IU]/mL (ref <5)

## 2020-02-11 LAB — TROPONIN I (HIGH SENSITIVITY): Troponin I (High Sensitivity): <2 ng/L (ref <18)

## 2020-02-11 LAB — TSH: TSH: 1.906 u[IU]/mL (ref 0.350–4.500)

## 2020-02-11 MED ORDER — MORPHINE SULFATE (PF) 4 MG/ML IV SOLN
4.0000 mg | Freq: Once | INTRAVENOUS | Status: AC
  Administered 2020-02-11: 4 mg via INTRAVENOUS
  Filled 2020-02-11: qty 1

## 2020-02-11 MED ORDER — IOHEXOL 350 MG/ML SOLN
100.0000 mL | Freq: Once | INTRAVENOUS | Status: AC | PRN
  Administered 2020-02-11: 54 mL via INTRAVENOUS

## 2020-02-11 MED ORDER — SODIUM CHLORIDE 0.9 % IV BOLUS
1000.0000 mL | Freq: Once | INTRAVENOUS | Status: AC
  Administered 2020-02-11: 1000 mL via INTRAVENOUS

## 2020-02-11 MED ORDER — SODIUM CHLORIDE 0.9 % IV BOLUS: 1000.0000 mL | Freq: Once | INTRAVENOUS | Status: DC

## 2020-02-11 NOTE — ED Triage Notes (Signed)
BIB [facility] w/ complaints of centralized chest pain that started this morning. Pt reports a sharp pressure in the middle of the chest. EMS reports the pt seems to report additional pain with movement. Pt rates pain 8/10. Denies SOB, n/v/d. VSS. NAD.

## 2020-02-11 NOTE — Discharge Instructions (Signed)
Follow the instructions regarding your high heart rate. It is important for you to follow-up with the cardiologist listed below. Return to the ER if you again start to feel like your heart is beating really fast, having chest pain, shortness of breath or loss of consciousness.

## 2020-02-11 NOTE — ED Provider Notes (Signed)
[facility] EMERGENCY DEPARTMENT Provider Note   CSN: [id number] Arrival date & time: 02/11/20  7741     History Chief Complaint  Patient presents with  . Chest Pain    Virginia Pearson is a 24 y.o. female who presents to ED with a chief complaint of chest pain.  Since yesterday has been having central chest pain that is worse with deep inspiration.  Reports some shortness of breath when the pain worsens.  Denies shortness of breath otherwise.  No history of similar symptoms in the past.  Pain is also worse with movement.  She denies any leg swelling, cough, hemoptysis, abdominal pain, vomiting, fever or sick contact with similar symptoms.  No injury or trauma.  She denies any OCP use.  No history of DVT, PE or MI.  She denies possibility of pregnancy.  HPI     No past medical history on file.  Patient Active Problem List   Diagnosis Date Noted  . Normal labor 02/26/2019    No past surgical history on file.   OB History    Gravida  2   Para  2   Term  2   Preterm      AB      Living  2     SAB      TAB      Ectopic      Multiple  0   Live Births  2           No family history on file.  Social History   Tobacco Use  . Smoking status: Never Smoker  . Smokeless tobacco: Never Used  Substance Use Topics  . Alcohol use: No  . Drug use: No    Home Medications Prior to Admission medications   Not on File    Allergies    Patient has no known allergies.  Review of Systems   Review of Systems  Constitutional: Negative for appetite change, chills and fever.  HENT: Negative for ear pain, rhinorrhea, sneezing and sore throat.   Eyes: Negative for photophobia and visual disturbance.  Respiratory: Positive for shortness of breath. Negative for cough, chest tightness and wheezing.   Cardiovascular: Positive for chest pain. Negative for palpitations.  Gastrointestinal: Negative for abdominal pain, blood in stool, constipation,  diarrhea, nausea and vomiting.  Genitourinary: Negative for dysuria, hematuria and urgency.  Musculoskeletal: Negative for myalgias.  Skin: Negative for rash.  Neurological: Negative for dizziness, weakness and light-headedness.    Physical Exam Updated Vital Signs BP 124/71   Pulse 72   Temp 98.9 F (37.2 C) (Oral)   Resp 20   Ht 5\' 4"  (1.626 m)   Wt 72.6 kg   LMP 01/09/2020   SpO2 100%   BMI 27.46 kg/m   Physical Exam Vitals and nursing note reviewed.  Constitutional:      General: She is not in acute distress.    Appearance: She is well-developed.  HENT:     Head: Normocephalic and atraumatic.     Nose: Nose normal.  Eyes:     General: No scleral icterus.       Left eye: No discharge.     Conjunctiva/sclera: Conjunctivae normal.  Cardiovascular:     Rate and Rhythm: Normal rate and regular rhythm.     Heart sounds: Normal heart sounds. No murmur heard.  No friction rub. No gallop.   Pulmonary:     Effort: Pulmonary effort is normal. No respiratory distress.  Breath sounds: Normal breath sounds.  Chest:     Chest wall: Tenderness present.    Abdominal:     General: Bowel sounds are normal. There is no distension.     Palpations: Abdomen is soft.     Tenderness: There is no abdominal tenderness. There is no guarding.  Musculoskeletal:        General: Normal range of motion.     Cervical back: Normal range of motion and neck supple.     Right lower leg: No edema.     Left lower leg: No edema.  Skin:    General: Skin is warm and dry.     Findings: No rash.  Neurological:     Mental Status: She is alert.     Motor: No abnormal muscle tone.     Coordination: Coordination normal.     ED Results / Procedures / Treatments   Labs (all labs ordered are listed, but only abnormal results are displayed) Labs Reviewed  BASIC METABOLIC PANEL - Abnormal; Notable for the following components:      Result Value   Glucose, Bld 116 (*)    All other components  within normal limits  CBC WITH DIFFERENTIAL/PLATELET - Abnormal; Notable for the following components:   Hemoglobin 11.6 (*)    HCT 35.5 (*)    MCV 75.4 (*)    MCH 24.6 (*)    All other components within normal limits  D-DIMER, QUANTITATIVE (NOT AT [facility]) - Abnormal; Notable for the following components:   D-Dimer, Quant 0.62 (*)    All other components within normal limits  TSH  I-STAT BETA HCG BLOOD, ED ([facility], [facility], [facility] ONLY)  TROPONIN I (HIGH SENSITIVITY)    EKG EKG Interpretation  Date/Time:  Wednesday February 11 2020 10:32:23 EDT Ventricular Rate:  126 PR Interval:    QRS Duration: 88 QT Interval:  318 QTC Calculation: 461 R Axis:   53 Text Interpretation: Sinus tachycardia Nonspecific T abnormalities, anterior leads Since prior ECG, rate has increased, no other significant changes Confirmed by Alvira Monday ([id number]) on 02/11/2020 11:26:16 AM   Radiology DG Chest 2 View  Result Date: 02/11/2020 CLINICAL DATA:  Chest pain that started this morning EXAM: CHEST - 2 VIEW COMPARISON:  01/02/2013 FINDINGS: Normal heart size and mediastinal contours. No acute infiltrate or edema. No effusion or pneumothorax. No acute osseous findings. Artifact from EKG leads IMPRESSION: No active cardiopulmonary disease. Electronically Signed   By: Marnee Spring M.D.   On: 02/11/2020 09:03   CT Angio Chest PE W/Cm &/Or Wo Cm  Result Date: 02/11/2020 CLINICAL DATA:  Central chest pain.  Elevated D-dimer. EXAM: CT ANGIOGRAPHY CHEST WITH CONTRAST TECHNIQUE: Multidetector CT imaging of the chest was performed using the standard protocol during bolus administration of intravenous contrast. Multiplanar CT image reconstructions and MIPs were obtained to evaluate the vascular anatomy. CONTRAST:  32mL OMNIPAQUE IOHEXOL 350 MG/ML SOLN COMPARISON:  Chest x-ray from same day. FINDINGS: Cardiovascular: Satisfactory opacification of the pulmonary arteries to the segmental level. No evidence of pulmonary embolism.  Normal heart size. No pericardial effusion. No thoracic aortic aneurysm or dissection. Mediastinum/Nodes: Residual thymus in the anterior mediastinum. No enlarged mediastinal, hilar, or axillary lymph nodes. Thyroid gland, trachea, and esophagus demonstrate no significant findings. Lungs/Pleura: Lungs are clear. No pleural effusion or pneumothorax. Upper Abdomen: No acute abnormality. Musculoskeletal: No chest wall abnormality. No acute or significant osseous findings. Review of the MIP images confirms the above findings. IMPRESSION: 1. Normal CTA of the  chest.  No evidence of pulmonary embolism. Electronically Signed   By: Obie Dredge M.D.   On: 02/11/2020 13:34    Procedures Procedures (including critical care time)  Medications Ordered in ED Medications  morphine 4 MG/ML injection 4 mg (4 mg Intravenous Given 02/11/20 0938)  sodium chloride 0.9 % bolus 1,000 mL (0 mLs Intravenous Stopped 02/11/20 1340)  iohexol (OMNIPAQUE) 350 MG/ML injection 100 mL (54 mLs Intravenous Contrast Given 02/11/20 1316)    ED Course  I have reviewed the triage vital signs and the nursing notes.  Pertinent labs & imaging results that were available during my care of the patient were reviewed by me and considered in my medical decision making (see chart for details).  Clinical Course as of Feb 11 1452  Wed Feb 11, 2020  1607 Patient with episode of heart rate in the 160s per RN. On my arrival HR improved to 120s, appears to be sinus tachycardia on monitor. EKG unable to be obtained during the previous episode.   [HK]    Clinical Course User Index [HK] Dietrich Pates, PA-C   MDM Rules/Calculators/A&P                          24 year old female presenting to the ED with a chief complaint of pleuritic chest pain since yesterday.  Reports some associated shortness of breath when the chest pain worsens.  No history of DVT, PE, leg swelling, hemoptysis or fever.  Abdomen is soft, nontender nondistended.  She does  appear uncomfortable and there is tenderness palpation of the left chest wall.  She is not hypoxic or tachycardic.  Will obtain lab work, EKG, chest x-ray and reassess.  EKG shows normal sinus rhythm, chest x-ray without any acute findings.  Lab work significant for D-dimer elevated at 1.62.  Troponin negative x1.  CBC, BMP unremarkable.  TSH is normal.  hCG is negative.  Patient had an episode on recheck by RN with heart rates in the 160s.  Unfortunately EKG was not obtained at the time, by the time I got into her room it had improved to 120s and appeared to be sinus tachycardia on monitor.  Telemetry strip reviewed, unsure if this is SVT versus sinus tachycardia during that episode.  She had another episode with heart rates in the 130s approximately 30 minutes after the first 1.  She was symptomatic at that time.  This improved without intervention.  She did not have the symptoms yesterday.  CTA without any evidence of PE or other abnormalities.  Question whether this could be PSVT.  She remains in normal sinus rhythm since these 2 episodes and was observed for about 2 hours after this.  She reports her symptoms have improved  Educated on vagal maneuvers.  Stressed importance of following up with cardiology and returning if the symptoms worsen.  Doubt ACS as the cause of her symptoms.  Suspect that this is the cause of her symptoms versus musculoskeletal pain as she is tender on exam.  Patient is agreeable to the plan.  She remains hemodynamically stable.  Return precautions given. Patient discussed with and seen by the attending, Dr. Dalene Seltzer.    Patient is hemodynamically stable, in NAD, and able to ambulate in the ED. Evaluation does not show pathology that would require ongoing emergent intervention or inpatient treatment. I explained the diagnosis to the patient. Pain has been managed and has no complaints prior to discharge. Patient is comfortable with above  plan and is stable for discharge at this  time. All questions were answered prior to disposition. Strict return precautions for returning to the ED were discussed. Encouraged follow up with PCP.   An After Visit Summary was printed and given to the patient.   Portions of this note were generated with Scientist, clinical (histocompatibility and immunogenetics)Dragon dictation software. Dictation errors may occur despite best attempts at proofreading.  Final Clinical Impression(s) / ED Diagnoses Final diagnoses:  Tachycardia, paroxysmal [facility](HCC)    Rx / DC Orders ED Discharge Orders    None       Dietrich PatesKhatri, Aleksa Catterton, PA-C 02/11/20 1453    Alvira MondaySchlossman, Erin, MD 02/12/20 1724

## 2020-03-03 ENCOUNTER — Ambulatory Visit (INDEPENDENT_AMBULATORY_CARE_PROVIDER_SITE_OTHER): Payer: Medicaid Other | Admitting: Internal Medicine

## 2020-03-03 ENCOUNTER — Encounter: Payer: Self-pay | Admitting: Internal Medicine

## 2020-03-03 ENCOUNTER — Other Ambulatory Visit: Payer: Self-pay

## 2020-03-03 VITALS — BP 126/78 | HR 74 | Ht 64.0 in | Wt 219.0 lb

## 2020-03-03 DIAGNOSIS — R079 Chest pain, unspecified: Secondary | ICD-10-CM | POA: Diagnosis not present

## 2020-03-03 NOTE — Patient Instructions (Signed)
Medication Instructions:   Your physician recommends that you continue on your current medications as directed. Please refer to the Current Medication list given to you today.  *If you need a refill on your cardiac medications before your next appointment, please call your pharmacy*  Testing/Procedures:  Your physician has requested that you have an exercise tolerance test. For further information please visit [email or web address] Please also follow instruction sheet, as given.   Follow-Up: At [facility], you and your health needs are our priority.  As part of our continuing mission to provide you with exceptional heart care, we have created designated Provider Care Teams.  These Care Teams include your primary Cardiologist (physician) and Advanced Practice Providers (APPs -  Physician Assistants and Nurse Practitioners) who all work together to provide you with the care you need, when you need it.  We recommend signing up for the patient portal called "MyChart".  Sign up information is provided on this After Visit Summary.  MyChart is used to connect with patients for Virtual Visits (Telemedicine).  Patients are able to view lab/test results, encounter notes, upcoming appointments, etc.  Non-urgent messages can be sent to your provider as well.   To learn more about what you can do with MyChart, go to [email or web address].    Your next appointment:   6 month(s)  The format for your next appointment:   In Person  Provider:   Riley Lam, MD

## 2020-03-03 NOTE — Progress Notes (Signed)
Cardiology Office Note:    Date:  03/03/2020   ID:  Virginia Pearson, DOB [birth year], MRN [id number]  PCP:  Pcp, No   Referring MD: Alvira Monday, MD   CC: Chest pain Consulted for the evaluation of chest pain at the behest of Dr. Alvira Monday No history of MI in the family  History of Present Illness:    Virginia Pearson is a 24 y.o. female with a hx of chest pain.  Recently seen 02/11/20  with chest pain worse with inspiration.  Troponin X1 < 2.  CTPE showed no PE or coronary calcifications.    Patient notes that on Monday she felt chest discomfort.  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  Chest pain occurred again at the mall.  This occurred with eating.  During ED eval, patients chest pain.  She took her boyfriend to work and had worsening chest pain while driving.  Felt like would had after a work out.  Pain work her from sleep.  Had this pain in 2015; and felt a feeling of paralysis at that time (when she was on the bus).  Had light-headed and orthopnea at that time.  Able to exert herself and lift heavy objects without issues.  No exertional pain; may radiated to her arm.  No syncope.  Past Medical History:  Diagnosis Date  . Chest pain   . SOB (shortness of breath)   . Tachycardia     Past Surgical History:  Procedure Laterality Date  . NO PAST SURGERIES     Current Medications: No outpatient medications have been marked as taking for the 03/03/20 encounter (Office Visit) with Christell Constant, MD.     Allergies:   Patient has no known allergies.   Social History   Socioeconomic History  . Marital status: Single    Spouse name: Not on file  . Number of children: Not on file  . Years of education: Not on file  . Highest education level: Not on file  Occupational History  . Not on file  Tobacco Use  . Smoking status: Never Smoker  . Smokeless tobacco: Never Used  Substance and Sexual Activity  . Alcohol  use: No  . Drug use: No  . Sexual activity: Never  Other Topics Concern  . Not on file  Social History Narrative  . Not on file   Social Determinants of Health   Financial Resource Strain:   . Difficulty of Paying Living Expenses: Not on file  Food Insecurity:   . Worried About Programme researcher, broadcasting/film/video in the Last Year: Not on file  . Ran Out of Food in the Last Year: Not on file  Transportation Needs:   . Lack of Transportation (Medical): Not on file  . Lack of Transportation (Non-Medical): Not on file  Physical Activity:   . Days of Exercise per Week: Not on file  . Minutes of Exercise per Session: Not on file  Stress:   . Feeling of Stress : Not on file  Social Connections:   . Frequency of Communication with Friends and Family: Not on file  . Frequency of Social Gatherings with Friends and Family: Not on file  . Attends Religious Services: Not on file  . Active Member of Clubs or Organizations: Not on file  . Attends Banker Meetings: Not on file  . Marital Status: Not on file    Family History: The patient's family history is notable for  high blood pressure in multiple family members but no prior MI.  ROS:   Please see the history of present illness.    All other systems reviewed and are negative.  EKGs/Labs/Other Studies Reviewed:    The following studies were reviewed today:  EKG:  EKG is  ordered today.  The ekg ordered today demonstrates sinus rhythm with rate of 75 non specific TW changes similar to 02/11/20 02/11/20 Sins rhythm with 72 with non specific T wave changes  Recent Labs: 02/11/2020: BUN 8; Creatinine, Ser 0.59; Hemoglobin 11.6; Platelets 342; Potassium 3.7; Sodium 138; TSH 1.906    Physical Exam:    VS:  BP 126/78   Pulse 74   Ht 5\' 4"  (1.626 m)   Wt 219 lb (99.3 kg)   SpO2 98%   BMI 37.59 kg/m     Wt Readings from Last 3 Encounters:  03/03/20 219 lb (99.3 kg)  02/11/20 160 lb (72.6 kg)  02/26/19 179 lb 14.3 oz (81.6 kg)     GEN: Obese, well developed in no acute distress HEENT: Normal NECK: No JVD; No carotid bruits LYMPHATICS: No lymphadenopathy CARDIAC: RRR, no murmurs, rubs, gallops RESPIRATORY:  Clear to auscultation without rales, wheezing or rhonchi  ABDOMEN: Soft, non-tender, non-distended MUSCULOSKELETAL:  No edema; No deformity  SKIN: Warm and dry NEUROLOGIC:  Alert and oriented x 3 PSYCHIATRIC:  Normal affect   ASSESSMENT:    1. Chest pain of uncertain etiology    PLAN:    In order of problems listed above:  Chest Pain Obesity - Both episodes typical and atypical chest pain - Will attempt ECG Exercise stress test  6 month follow up unless new symptoms or abnormal test results warranting change in plan  Would be reasonable for Virtual Follow up Would be reasonable for  APP Follow up   Medication Adjustments/Labs and Tests Ordered: Current medicines are reviewed at length with the patient today.  Concerns regarding medicines are outlined above.  Orders Placed This Encounter  Procedures  . Exercise Tolerance Test  . EKG 12-Lead   No orders of the defined types were placed in this encounter.   Patient Instructions  Medication Instructions:   Your physician recommends that you continue on your current medications as directed. Please refer to the Current Medication list given to you today.  *If you need a refill on your cardiac medications before your next appointment, please call your pharmacy*  Testing/Procedures:  Your physician has requested that you have an exercise tolerance test. For further information please visit 02/28/19. Please also follow instruction sheet, as given.   Follow-Up: At [facility], you and your health needs are our priority.  As part of our continuing mission to provide you with exceptional heart care, we have created designated Provider Care Teams.  These Care Teams include your primary Cardiologist (physician) and Advanced Practice  Providers (APPs -  Physician Assistants and Nurse Practitioners) who all work together to provide you with the care you need, when you need it.  We recommend signing up for the patient portal called "MyChart".  Sign up information is provided on this After Visit Summary.  MyChart is used to connect with patients for Virtual Visits (Telemedicine).  Patients are able to view lab/test results, encounter notes, upcoming appointments, etc.  Non-urgent messages can be sent to your provider as well.   To learn more about what you can do with MyChart, go to [facility].    Your next appointment:   6 month(s)  The format for your next appointment:   In Person  Provider:   Riley Lam, MD        Signed, Christell Constant, MD  03/03/2020 12:13 PM    [facility]

## 2020-03-26 ENCOUNTER — Other Ambulatory Visit (HOSPITAL_COMMUNITY)
Admission: RE | Admit: 2020-03-26 | Discharge: 2020-03-26 | Disposition: A | Discharge status: Home / Self Care | Payer: Medicaid Other | Source: Ambulatory Visit | Attending: Internal Medicine | Admitting: Internal Medicine

## 2020-03-26 DIAGNOSIS — Z01812 Encounter for preprocedural laboratory examination: Secondary | ICD-10-CM | POA: Diagnosis present

## 2020-03-26 DIAGNOSIS — Z20822 Contact with and (suspected) exposure to covid-19: Secondary | ICD-10-CM | POA: Insufficient documentation

## 2020-03-26 LAB — SARS CORONAVIRUS 2 (TAT 6-24 HRS): SARS Coronavirus 2: NEGATIVE

## 2020-03-30 ENCOUNTER — Ambulatory Visit (INDEPENDENT_AMBULATORY_CARE_PROVIDER_SITE_OTHER): Payer: Medicaid Other

## 2020-03-30 ENCOUNTER — Other Ambulatory Visit: Payer: Self-pay

## 2020-03-30 DIAGNOSIS — R079 Chest pain, unspecified: Secondary | ICD-10-CM | POA: Diagnosis not present

## 2020-03-30 LAB — EXERCISE TOLERANCE TEST
Estimated workload: 8.5 METS
Exercise duration (min): 6 min
Exercise duration (sec): 31 s
MPHR: 198 {beats}/min
Peak HR: 181 {beats}/min
Percent HR: 91 %
RPE: 17
Rest HR: 74 {beats}/min

## 2020-09-30 LAB — LAB REPORT - SCANNED: Pap: NEGATIVE

## 2020-10-04 NOTE — Progress Notes (Signed)
Cardiology Office Note:    Date:  10/05/2020   ID:  Virginia Pearson, DOB 11/20/1995, MRN [id number]  PCP:  Pcp, No   Referring MD: No ref. provider found   CC: Chest pain f/u (new palpitations).  History of Present Illness:    Virginia Pearson is a 25 y.o. female with a hx of chest pain.  Recently seen 02/11/20  with chest pain worse with inspiration.  Troponin X1 < 2.  CTPE showed no PE or coronary calcifications.    Patient notes that she is doing fine today.  Yesterday felt a bit dizzy but only with a migrane.  Notes that she has frequent migraines in the summer.    No chest pressure- notes rare 5 second pain.  Has not had this for two weeks.  Two weeks ago notes that she have chest pain when she has migraines (has an odd smell, and lights that lead to this starting).  At that time fell difficult to breath.  No SOB/DOE and no PND/Orthopnea outside of the migraine event.  No weight gain or leg swelling.  Notes palpitations on Sunday with  dizziness with migraine and notes a sweet taste in her mouth.   Past Medical History:  Diagnosis Date  . Chest pain   . SOB (shortness of breath)   . Tachycardia     Past Surgical History:  Procedure Laterality Date  . NO PAST SURGERIES     Current Medications: Current Meds  Medication Sig  . norethindrone (MICRONOR) 0.35 MG tablet Take 1 tablet by mouth daily.     Allergies:   Patient has no known allergies.   Social History   Socioeconomic History  . Marital status: Single    Spouse name: Not on file  . Number of children: Not on file  . Years of education: Not on file  . Highest education level: Not on file  Occupational History  . Not on file  Tobacco Use  . Smoking status: Never Smoker  . Smokeless tobacco: Never Used  Substance and Sexual Activity  . Alcohol use: No  . Drug use: No  . Sexual activity: Never  Other Topics Concern  . Not on file  Social History Narrative  . Not on file   Social Determinants  of Health   Financial Resource Strain: Not on file  Food Insecurity: Not on file  Transportation Needs: Not on file  Physical Activity: Not on file  Stress: Not on file  Social Connections: Not on file    Family History: The patient's family history is notable for high blood pressure in multiple family members but no prior MI.  ROS:   Please see the history of present illness.    All other systems reviewed and are negative.  EKGs/Labs/Other Studies Reviewed:    The following studies were reviewed today:  EKG:   03/03/21: sinus rhythm with rate of 75 non specific TW changes similar to 02/11/20 02/11/20 Sins rhythm with 72 with non specific T wave changes  ECG Stress Testing : Date: 03/30/20 Results:  Blood pressure demonstrated a normal response to exercise.  There was no ST segment deviation noted during stress.  No T wave inversion was noted during stress.   Negative adequate stress test. No ST changes. There were baseline t wave inversions that changed to upright early in recovery and then returned to baseline late in recovery.   Recent Labs: 02/11/2020: BUN 8; Creatinine, Ser 0.59; Hemoglobin 11.6; Platelets 342; Potassium 3.7; Sodium 138;  TSH 1.906    Physical Exam:    VS:  BP 130/70   Pulse 71   Ht 5' (1.524 m)   Wt 98.4 kg   SpO2 99%   BMI 42.38 kg/m     Wt Readings from Last 3 Encounters:  10/05/20 98.4 kg  03/03/20 99.3 kg  02/11/20 72.6 kg    GEN: Obese, well developed in no acute distress HEENT: Normal NECK: No JVD;  LYMPHATICS: No lymphadenopathy CARDIAC: RRR, no murmurs, rubs, gallops RESPIRATORY:  Clear to auscultation without rales, wheezing or rhonchi  ABDOMEN: Soft, non-tender, non-distended MUSCULOSKELETAL:  No edema; No deformity  SKIN: Warm and dry NEUROLOGIC:  Alert and oriented x 3 PSYCHIATRIC:  Normal affect   ASSESSMENT:    1. SOB (shortness of breath)   2. Palpitations    PLAN:    In order of problems listed  above:  Complex Migraine Anemia NOS Chest Pain Resolved (resolved) Obesity Shortness of breath Palpitations - was told that she needs to be evaluated from cardiology perspective prior to getting back on birth control - will obtain 14-day non live heart monitor (ZioPatch) - will get echocardiogram  PRN follow up unless new symptoms or abnormal test results warranting change in plan Would benefit from interpretor is subsequent visits  Would be reasonable for  APP Follow up    Medication Adjustments/Labs and Tests Ordered: Current medicines are reviewed at length with the patient today.  Concerns regarding medicines are outlined above.  Orders Placed This Encounter  Procedures  . LONG TERM MONITOR (3-14 DAYS)  . ECHOCARDIOGRAM COMPLETE   No orders of the defined types were placed in this encounter.   Patient Instructions  Medication Instructions:  Your physician recommends that you continue on your current medications as directed. Please refer to the Current Medication list given to you today.  *If you need a refill on your cardiac medications before your next appointment, please call your pharmacy*   Lab Work: NONE If you have labs (blood work) drawn today and your tests are completely normal, you will receive your results only by: Marland Kitchen MyChart Message (if you have MyChart) OR . A paper copy in the mail If you have any lab test that is abnormal or we need to change your treatment, we will call you to review the results.   Testing/Procedures: Your physician has requested that you have an echocardiogram. Echocardiography is a painless test that uses sound waves to create images of your heart. It provides your doctor with information about the size and shape of your heart and how well your heart's chambers and valves are working. This procedure takes approximately one hour. There are no restrictions for this procedure.  Your physician has requested that you wear a heart  monitor.    Follow-Up: Based on results At [facility], you and your health needs are our priority.  As part of our continuing mission to provide you with exceptional heart care, we have created designated Provider Care Teams.  These Care Teams include your primary Cardiologist (physician) and Advanced Practice Providers (APPs -  Physician Assistants and Nurse Practitioners) who all work together to provide you with the care you need, when you need it.  We recommend signing up for the patient portal called "MyChart".  Sign up information is provided on this After Visit Summary.  MyChart is used to connect with patients for Virtual Visits (Telemedicine).  Patients are able to view lab/test results, encounter notes, upcoming appointments, etc.  Non-urgent  messages can be sent to your provider as well.   To learn more about what you can do with MyChart, go to [email or web address].     Other Instructions ZIO XT- Long Term Monitor Instructions   Your physician has requested you wear a ZIO patch monitor for _14__ days.  This is a single patch monitor.   [employer] supplies one patch monitor per enrollment. Additional stickers are not available. Please do not apply patch if you will be having a Nuclear Stress Test, Echocardiogram, Cardiac CT, MRI, or Chest Xray during the period you would be wearing the monitor. The patch cannot be worn during these tests. You cannot remove and re-apply the ZIO XT patch monitor.  Your ZIO patch monitor will be sent Fed Ex from [employer] directly to your home address. It may take 3-5 days to receive your monitor after you have been enrolled.  Once you have received your monitor, please review the enclosed instructions. Your monitor has already been registered assigning a specific monitor serial # to you.  Billing and Patient Assistance Program Information   We have supplied [employer] with any of your insurance information on file for billing  purposes. [employer] offers a sliding scale Patient Assistance Program for patients that do not have insurance, or whose insurance does not completely cover the cost of the ZIO monitor.   You must apply for the Patient Assistance Program to qualify for this discounted rate.     To apply, please call [employer] at [phone], select option 4, then select option 2, and ask to apply for Patient Assistance Program.  Meredeth Ide will ask your household income, and how many people are in your household.  They will quote your out-of-pocket cost based on that information.  [employer] will also be able to set up a 35-month, interest-free payment plan if needed.  Applying the monitor   Shave hair from upper left chest.  Hold abrader disc by orange tab. Rub abrader in 40 strokes over the upper left chest as indicated in your monitor instructions.  Clean area with 4 enclosed alcohol pads. Let dry.  Apply patch as indicated in monitor instructions. Patch will be placed under collarbone on left side of chest with arrow pointing upward.  Rub patch adhesive wings for 2 minutes. Remove white label marked "1". Remove the white label marked "2". Rub patch adhesive wings for 2 additional minutes.  While looking in a mirror, press and release button in center of patch. A small green light will flash 3-4 times. This will be your only indicator that the monitor has been turned on. ?  Do not shower for the first 24 hours. You may shower after the first 24 hours.  Press the button if you feel a symptom. You will hear a small click. Record Date, Time and Symptom in the Patient Logbook.  When you are ready to remove the patch, follow instructions on the last 2 pages of the Patient Logbook. Stick patch monitor onto the last page of Patient Logbook.  Place Patient Logbook in the blue and white box.  Use locking tab on box and tape box closed securely.  The blue and white box has prepaid postage on it. Please place it in the mailbox as soon  as possible. Your physician should have your test results approximately 7 days after the monitor has been mailed back to [facility].  Call [facility] Customer Care at [phone] if you have questions regarding your ZIO XT patch monitor. Call them immediately  if you see an orange light blinking on your monitor.  If your monitor falls off in less than 4 days, contact our Monitor department at [phone]. ?If your monitor becomes loose or falls off after 4 days call [employer] at [phone] for suggestions on securing your monitor.?     Signed, Christell Constant, MD  10/05/2020 2:33 PM    [facility]

## 2020-10-05 ENCOUNTER — Other Ambulatory Visit: Payer: Self-pay

## 2020-10-05 ENCOUNTER — Ambulatory Visit (INDEPENDENT_AMBULATORY_CARE_PROVIDER_SITE_OTHER): Payer: Medicaid Other

## 2020-10-05 ENCOUNTER — Ambulatory Visit: Payer: Medicaid Other | Admitting: Internal Medicine

## 2020-10-05 ENCOUNTER — Encounter: Payer: Self-pay | Admitting: Internal Medicine

## 2020-10-05 VITALS — BP 130/70 | HR 71 | Ht 60.0 in | Wt 217.0 lb

## 2020-10-05 DIAGNOSIS — R002 Palpitations: Secondary | ICD-10-CM

## 2020-10-05 DIAGNOSIS — R0602 Shortness of breath: Secondary | ICD-10-CM

## 2020-10-05 NOTE — Patient Instructions (Signed)
Medication Instructions:  Your physician recommends that you continue on your current medications as directed. Please refer to the Current Medication list given to you today.  *If you need a refill on your cardiac medications before your next appointment, please call your pharmacy*   Lab Work: NONE If you have labs (blood work) drawn today and your tests are completely normal, you will receive your results only by: Marland Kitchen MyChart Message (if you have MyChart) OR . A paper copy in the mail If you have any lab test that is abnormal or we need to change your treatment, we will call you to review the results.   Testing/Procedures: Your physician has requested that you have an echocardiogram. Echocardiography is a painless test that uses sound waves to create images of your heart. It provides your doctor with information about the size and shape of your heart and how well your heart's chambers and valves are working. This procedure takes approximately one hour. There are no restrictions for this procedure.  Your physician has requested that you wear a heart monitor.    Follow-Up: Based on results At [facility], you and your health needs are our priority.  As part of our continuing mission to provide you with exceptional heart care, we have created designated Provider Care Teams.  These Care Teams include your primary Cardiologist (physician) and Advanced Practice Providers (APPs -  Physician Assistants and Nurse Practitioners) who all work together to provide you with the care you need, when you need it.  We recommend signing up for the patient portal called "MyChart".  Sign up information is provided on this After Visit Summary.  MyChart is used to connect with patients for Virtual Visits (Telemedicine).  Patients are able to view lab/test results, encounter notes, upcoming appointments, etc.  Non-urgent messages can be sent to your provider as well.   To learn more about what you can do with  MyChart, go to [email or web address].     Other Instructions ZIO XT- Long Term Monitor Instructions   Your physician has requested you wear a ZIO patch monitor for _14__ days.  This is a single patch monitor.   [employer] supplies one patch monitor per enrollment. Additional stickers are not available. Please do not apply patch if you will be having a Nuclear Stress Test, Echocardiogram, Cardiac CT, MRI, or Chest Xray during the period you would be wearing the monitor. The patch cannot be worn during these tests. You cannot remove and re-apply the ZIO XT patch monitor.  Your ZIO patch monitor will be sent Fed Ex from [employer] directly to your home address. It may take 3-5 days to receive your monitor after you have been enrolled.  Once you have received your monitor, please review the enclosed instructions. Your monitor has already been registered assigning a specific monitor serial # to you.  Billing and Patient Assistance Program Information   We have supplied [employer] with any of your insurance information on file for billing purposes. [employer] offers a sliding scale Patient Assistance Program for patients that do not have insurance, or whose insurance does not completely cover the cost of the ZIO monitor.   You must apply for the Patient Assistance Program to qualify for this discounted rate.     To apply, please call [employer] at [phone], select option 4, then select option 2, and ask to apply for Patient Assistance Program.  Meredeth Ide will ask your household income, and how many people are in your household.  They will  quote your out-of-pocket cost based on that information.  [employer] will also be able to set up a 32-month, interest-free payment plan if needed.  Applying the monitor   Shave hair from upper left chest.  Hold abrader disc by orange tab. Rub abrader in 40 strokes over the upper left chest as indicated in your monitor instructions.  Clean area with 4 enclosed alcohol  pads. Let dry.  Apply patch as indicated in monitor instructions. Patch will be placed under collarbone on left side of chest with arrow pointing upward.  Rub patch adhesive wings for 2 minutes. Remove white label marked "1". Remove the white label marked "2". Rub patch adhesive wings for 2 additional minutes.  While looking in a mirror, press and release button in center of patch. A small green light will flash 3-4 times. This will be your only indicator that the monitor has been turned on. ?  Do not shower for the first 24 hours. You may shower after the first 24 hours.  Press the button if you feel a symptom. You will hear a small click. Record Date, Time and Symptom in the Patient Logbook.  When you are ready to remove the patch, follow instructions on the last 2 pages of the Patient Logbook. Stick patch monitor onto the last page of Patient Logbook.  Place Patient Logbook in the blue and white box.  Use locking tab on box and tape box closed securely.  The blue and white box has prepaid postage on it. Please place it in the mailbox as soon as possible. Your physician should have your test results approximately 7 days after the monitor has been mailed back to [facility].  Call [facility] Customer Care at [phone] if you have questions regarding your ZIO XT patch monitor. Call them immediately if you see an orange light blinking on your monitor.  If your monitor falls off in less than 4 days, contact our Monitor department at [phone]. ?If your monitor becomes loose or falls off after 4 days call [employer] at [phone] for suggestions on securing your monitor.?

## 2020-10-05 NOTE — Progress Notes (Unsigned)
Enrolled patient for a 14 day Zio XT  monitor to be mailed to patients home  °

## 2020-10-08 DIAGNOSIS — R002 Palpitations: Secondary | ICD-10-CM

## 2020-10-08 DIAGNOSIS — R0602 Shortness of breath: Secondary | ICD-10-CM

## 2020-10-29 ENCOUNTER — Other Ambulatory Visit: Payer: Self-pay

## 2020-10-29 ENCOUNTER — Ambulatory Visit (HOSPITAL_COMMUNITY): Payer: Medicaid Other | Attending: Internal Medicine

## 2020-10-29 DIAGNOSIS — R0602 Shortness of breath: Secondary | ICD-10-CM | POA: Insufficient documentation

## 2020-10-29 DIAGNOSIS — R002 Palpitations: Secondary | ICD-10-CM | POA: Diagnosis not present

## 2020-10-29 LAB — ECHOCARDIOGRAM COMPLETE
Area-P 1/2: 3.6 cm2
S' Lateral: 3.5 cm

## 2020-11-02 ENCOUNTER — Telehealth: Payer: Self-pay | Admitting: Internal Medicine

## 2020-11-02 NOTE — Telephone Encounter (Signed)
  Patient returning call regarding results 

## 2020-11-02 NOTE — Telephone Encounter (Signed)
Results reviewed with pt please see result note.

## 2020-11-02 NOTE — Telephone Encounter (Signed)
PT is returning a call about results 

## 2020-11-02 NOTE — Telephone Encounter (Signed)
Used interpreter [phone] to leave message to call back.

## 2021-05-10 ENCOUNTER — Encounter (HOSPITAL_COMMUNITY): Payer: Self-pay

## 2021-05-10 ENCOUNTER — Emergency Department (HOSPITAL_COMMUNITY)
Admission: EM | Admit: 2021-05-10 | Discharge: 2021-05-11 | Disposition: A | Discharge status: Home / Self Care | Payer: Medicaid Other | Attending: Emergency Medicine | Admitting: Emergency Medicine

## 2021-05-10 ENCOUNTER — Other Ambulatory Visit: Payer: Self-pay

## 2021-05-10 DIAGNOSIS — Z3A01 Less than 8 weeks gestation of pregnancy: Secondary | ICD-10-CM | POA: Insufficient documentation

## 2021-05-10 DIAGNOSIS — O2301 Infections of kidney in pregnancy, first trimester: Secondary | ICD-10-CM | POA: Insufficient documentation

## 2021-05-10 DIAGNOSIS — N10 Acute tubulo-interstitial nephritis: Secondary | ICD-10-CM | POA: Insufficient documentation

## 2021-05-10 DIAGNOSIS — O26899 Other specified pregnancy related conditions, unspecified trimester: Secondary | ICD-10-CM

## 2021-05-10 DIAGNOSIS — R102 Pelvic and perineal pain: Secondary | ICD-10-CM

## 2021-05-10 LAB — COMPREHENSIVE METABOLIC PANEL
ALT: 15 U/L (ref 0–44)
AST: 14 U/L — ABNORMAL LOW (ref 15–41)
Albumin: 3.6 g/dL (ref 3.5–5.0)
Alkaline Phosphatase: 100 U/L (ref 38–126)
Anion gap: 8 (ref 5–15)
BUN: 7 mg/dL (ref 6–20)
CO2: 22 mmol/L (ref 22–32)
Calcium: 8.9 mg/dL (ref 8.9–10.3)
Chloride: 102 mmol/L (ref 98–111)
Creatinine, Ser: 0.49 mg/dL (ref 0.44–1.00)
GFR, Estimated: >60 mL/min (ref >60)
Glucose, Bld: 113 mg/dL — ABNORMAL HIGH (ref 70–99)
Potassium: 3.6 mmol/L (ref 3.5–5.1)
Sodium: 132 mmol/L — ABNORMAL LOW (ref 135–145)
Total Bilirubin: 0.4 mg/dL (ref 0.3–1.2)
Total Protein: 7.7 g/dL (ref 6.5–8.1)

## 2021-05-10 LAB — CBC WITH DIFFERENTIAL/PLATELET
Abs Immature Granulocytes: 0.05 10*3/uL (ref 0.00–0.07)
Basophils Absolute: 0 10*3/uL (ref 0.0–0.1)
Basophils Relative: 0 %
Eosinophils Absolute: 0 10*3/uL (ref 0.0–0.5)
Eosinophils Relative: 0 %
HCT: 32.6 % — ABNORMAL LOW (ref 36.0–46.0)
Hemoglobin: 11.1 g/dL — ABNORMAL LOW (ref 12.0–15.0)
Immature Granulocytes: 0 %
Lymphocytes Relative: 12 %
Lymphs Abs: 1.6 10*3/uL (ref 0.7–4.0)
MCH: 26.1 pg (ref 26.0–34.0)
MCHC: 34 g/dL (ref 30.0–36.0)
MCV: 76.7 fL — ABNORMAL LOW (ref 80.0–100.0)
Monocytes Absolute: 0.6 10*3/uL (ref 0.1–1.0)
Monocytes Relative: 5 %
Neutro Abs: 10.5 10*3/uL — ABNORMAL HIGH (ref 1.7–7.7)
Neutrophils Relative %: 83 %
Platelets: 323 10*3/uL (ref 150–400)
RBC: 4.25 MIL/uL (ref 3.87–5.11)
RDW: 14.6 % (ref 11.5–15.5)
WBC: 12.8 10*3/uL — ABNORMAL HIGH (ref 4.0–10.5)
nRBC: 0 % (ref 0.0–0.2)

## 2021-05-10 LAB — I-STAT BETA HCG BLOOD, ED (MC, WL, AP ONLY): I-stat hCG, quantitative: >2000 m[IU]/mL — ABNORMAL HIGH (ref <5)

## 2021-05-10 NOTE — ED Triage Notes (Signed)
Pt reports with lower abdominal pain and lower back pain since today. Pt states that she is [redacted] weeks pregnant.

## 2021-05-11 ENCOUNTER — Emergency Department (HOSPITAL_COMMUNITY): Payer: Medicaid Other

## 2021-05-11 LAB — URINALYSIS, ROUTINE W REFLEX MICROSCOPIC
Bilirubin Urine: NEGATIVE
Glucose, UA: NEGATIVE mg/dL
Ketones, ur: NEGATIVE mg/dL
Nitrite: NEGATIVE
Protein, ur: NEGATIVE mg/dL
Specific Gravity, Urine: 1.011 (ref 1.005–1.030)
WBC, UA: >50 WBC/hpf — ABNORMAL HIGH (ref 0–5)
pH: 6 (ref 5.0–8.0)

## 2021-05-11 MED ORDER — ACETAMINOPHEN 325 MG PO TABS
650.0000 mg | ORAL_TABLET | Freq: Once | ORAL | Status: AC
  Administered 2021-05-11: 650 mg via ORAL
  Filled 2021-05-11: qty 2

## 2021-05-11 MED ORDER — SODIUM CHLORIDE 0.9 % IV SOLN
1.0000 g | Freq: Once | INTRAVENOUS | Status: AC
  Administered 2021-05-11: 1 g via INTRAVENOUS
  Filled 2021-05-11: qty 10

## 2021-05-11 MED ORDER — CEPHALEXIN 500 MG PO CAPS: 500.0000 mg | ORAL_CAPSULE | Freq: Four times a day (QID) | ORAL | 0 refills | Status: DC

## 2021-05-11 MED ORDER — PROMETHAZINE HCL 25 MG PO TABS: 25.0000 mg | ORAL_TABLET | Freq: Four times a day (QID) | ORAL | 0 refills | Status: DC | PRN

## 2021-05-11 NOTE — Discharge Instructions (Addendum)
We saw you in the the ER for abdominal pain. You have kidney infection. Take the antibiotics ordered. You are 7 weeks and 2 days pregnant - and have twin pregnancy. Follow up with OB.

## 2021-05-11 NOTE — ED Provider Notes (Addendum)
[facility]-EMERGENCY DEPT Provider Note   CSN: [id number] Arrival date & time: 05/10/21  2056     History  Chief Complaint  Patient presents with   Abdominal Pain   Back Pain    Virginia Pearson is a 26 y.o. female.  HPI    [age] year old female G3, P2 comes in with chief complaint of abdominal pain.  Patient reports lower abdominal pain and back pain since yesterday.  Patient has been pregnant for about 5 weeks she thinks.  LMP was in November.  She reports that she has been having pain in the lower quadrant and back since yesterday which is getting worse.  She has some nausea without vomiting.  Patient also reports that the pain is sharp and throbbing.  She has had history of kidney infections when she was pregnant in the past.   Home Medications Prior to Admission medications   Medication Sig Start Date End Date Taking? Authorizing Provider  cephALEXin (KEFLEX) 500 MG capsule Take 1 capsule (500 mg total) by mouth 4 (four) times daily. 05/11/21  Yes Derwood KaplanNanavati, Deontrey Massi, MD  promethazine (PHENERGAN) 25 MG tablet Take 1 tablet (25 mg total) by mouth every 6 (six) hours as needed for nausea or vomiting. 05/11/21  Yes Derwood KaplanNanavati, Whit Bruni, MD  norethindrone (MICRONOR) 0.35 MG tablet Take 1 tablet by mouth daily. 09/29/20   [provider]      Allergies    Patient has no known allergies.    Review of Systems   Review of Systems  Constitutional:  Positive for activity change.  Gastrointestinal:  Positive for abdominal pain.   Physical Exam Updated Vital Signs BP 130/66 (BP Location: Left Arm)    Pulse (!) 108    Temp 98.1 F (36.7 C) (Oral)    Resp 18    SpO2 100%  Physical Exam Vitals and nursing note reviewed.  Constitutional:      Appearance: She is well-developed.  HENT:     Head: Atraumatic.  Cardiovascular:     Rate and Rhythm: Normal rate.  Pulmonary:     Effort: Pulmonary effort is normal.  Abdominal:     Tenderness: There is abdominal  tenderness in the periumbilical area, suprapubic area, left upper quadrant and left lower quadrant. There is no guarding or rebound.  Musculoskeletal:     Cervical back: Normal range of motion and neck supple.  Skin:    General: Skin is warm and dry.  Neurological:     Mental Status: She is alert and oriented to person, place, and time.    ED Results / Procedures / Treatments   Labs (all labs ordered are listed, but only abnormal results are displayed) Labs Reviewed  CBC WITH DIFFERENTIAL/PLATELET - Abnormal; Notable for the following components:      Result Value   WBC 12.8 (*)    Hemoglobin 11.1 (*)    HCT 32.6 (*)    MCV 76.7 (*)    Neutro Abs 10.5 (*)    All other components within normal limits  COMPREHENSIVE METABOLIC PANEL - Abnormal; Notable for the following components:   Sodium 132 (*)    Glucose, Bld 113 (*)    AST 14 (*)    All other components within normal limits  URINALYSIS, ROUTINE W REFLEX MICROSCOPIC - Abnormal; Notable for the following components:   APPearance CLOUDY (*)    Hgb urine dipstick SMALL (*)    Leukocytes,Ua LARGE (*)    WBC, UA >50 (*)  Bacteria, UA MANY (*)    All other components within normal limits  I-STAT BETA HCG BLOOD, ED ([facility], [facility], [facility] ONLY) - Abnormal; Notable for the following components:   I-stat hCG, quantitative >2,000.0 (*)    All other components within normal limits  TYPE AND SCREEN    EKG None  Radiology US OB Comp < 14 Wks  Result Date: 05/11/2021 CLINICAL DATA:  Pelvic pain EXAM: TWIN OBSTETRICAL ULTRASOUND <14 WKS TECHNIQUE: Transabdominal ultrasound was performed for evaluation of the gestation as well as the maternal uterus and adnexal regions. COMPARISON:  None. FINDINGS: Number of IUPs:  2 Chorionicity/Amnionicity:  Dichorionic-diamniotic (thick membrane) TWIN 1 Yolk sac:  Visualized. Embryo:  Visualized. Cardiac Activity: Visualized. Heart Rate: 170 bpm CRL:   12.8 mm   7 w 4 d                  [place] EDC: 12/24/2021 TWIN  2 Yolk sac:  Visualized. Embryo:  Visualized. Cardiac Activity: Visualized. Heart Rate: 172 bpm CRL:   13.6 mm   7 w 4 d                  [place] EDC: 12/24/2021 Subchorionic hemorrhage:  Trace subchorionic hemorrhage Maternal uterus/adnexae: Right ovary is normal. Left ovary not visualized. No free fluid in the cul-de-sac. IMPRESSION: Live twin intrauterine gestations with approximate gestational age of [redacted] weeks and 4 days. EDC based on today's sonogram is 12/24/2021. Electronically Signed   By: Larose Hires D.O.   On: 05/11/2021 10:12   US OB Comp AddL Gest Less 14 Wks  Result Date: 05/11/2021 CLINICAL DATA:  Pelvic pain EXAM: TWIN OBSTETRICAL ULTRASOUND <14 WKS TECHNIQUE: Transabdominal ultrasound was performed for evaluation of the gestation as well as the maternal uterus and adnexal regions. COMPARISON:  None. FINDINGS: Number of IUPs:  2 Chorionicity/Amnionicity:  Dichorionic-diamniotic (thick membrane) TWIN 1 Yolk sac:  Visualized. Embryo:  Visualized. Cardiac Activity: Visualized. Heart Rate: 170 bpm CRL:   12.8 mm   7 w 4 d                  [place] EDC: 12/24/2021 TWIN 2 Yolk sac:  Visualized. Embryo:  Visualized. Cardiac Activity: Visualized. Heart Rate: 172 bpm CRL:   13.6 mm   7 w 4 d                  [place] EDC: 12/24/2021 Subchorionic hemorrhage:  Trace subchorionic hemorrhage Maternal uterus/adnexae: Right ovary is normal. Left ovary not visualized. No free fluid in the cul-de-sac. IMPRESSION: Live twin intrauterine gestations with approximate gestational age of [redacted] weeks and 4 days. EDC based on today's sonogram is 12/24/2021. Electronically Signed   By: Larose Hires D.O.   On: 05/11/2021 10:12   US OB Transvaginal  Result Date: 05/11/2021 CLINICAL DATA:  Pelvic pain EXAM: TWIN OBSTETRICAL ULTRASOUND <14 WKS TECHNIQUE: Transabdominal ultrasound was performed for evaluation of the gestation as well as the maternal uterus and adnexal regions. COMPARISON:  None. FINDINGS: Number of IUPs:  2  Chorionicity/Amnionicity:  Dichorionic-diamniotic (thick membrane) TWIN 1 Yolk sac:  Visualized. Embryo:  Visualized. Cardiac Activity: Visualized. Heart Rate: 170 bpm CRL:   12.8 mm   7 w 4 d                  [place] EDC: 12/24/2021 TWIN 2 Yolk sac:  Visualized. Embryo:  Visualized. Cardiac Activity: Visualized. Heart Rate: 172 bpm CRL:   13.6 mm   7 w  4 d                  [place] EDC: 12/24/2021 Subchorionic hemorrhage:  Trace subchorionic hemorrhage Maternal uterus/adnexae: Right ovary is normal. Left ovary not visualized. No free fluid in the cul-de-sac. IMPRESSION: Live twin intrauterine gestations with approximate gestational age of [redacted] weeks and 4 days. EDC based on today's sonogram is 12/24/2021. Electronically Signed   By: Larose Hires D.O.   On: 05/11/2021 10:12    Procedures Procedures    Medications Ordered in ED Medications  cefTRIAXone (ROCEPHIN) 1 g in sodium chloride 0.9 % 100 mL IVPB (has no administration in time range)  acetaminophen (TYLENOL) tablet 650 mg (has no administration in time range)    ED Course/ Medical Decision Making/ A&P                           Medical Decision Making Problems Addressed: Acute pyelonephritis: complicated acute illness or injury with systemic symptoms  Amount and/or Complexity of Data Reviewed Independent Historian: spouse Labs: ordered. Decision-making details documented in ED Course. Radiology: ordered. Decision-making details documented in ED Course. ECG/medicine tests: ordered.  Risk OTC drugs. Prescription drug management. Decision regarding hospitalization.   26 year old female comes in with chief complaint of left-sided pain. She is G3, P2, and suspects that she is about [redacted] weeks pregnant.  Initial differential diagnosis includes ectopic pregnancy, PID, TOA, ovarian cyst, kidney stones, pyelonephritis.  Patient's pregnancy test is positive.  Ultrasound ordered. Urine analysis also ordered and it is showing evidence of  infection.  Ultrasound rules out ectopic pregnancy.  She is noted to have twin gestation at 7 weeks and 2 days.  Results discussed with the patient.  She was given IV ceftriaxone for her pyelonephritis.  We discussed and consider admission, however patient feels better and would prefer going home if possible. She has passed oral challenge.  Stable for discharge.  She already has an OB appointment later this month.      Final Clinical Impression(s) / ED Diagnoses Final diagnoses:  Acute pyelonephritis  Less than [redacted] weeks gestation of pregnancy    Rx / DC Orders ED Discharge Orders          Ordered    cephALEXin (KEFLEX) 500 MG capsule  4 times daily        05/11/21 1200    promethazine (PHENERGAN) 25 MG tablet  Every 6 hours PRN        05/11/21 1200              Derwood Kaplan, MD 05/11/21 1207    Derwood Kaplan, MD 05/11/21 1209

## 2021-05-18 ENCOUNTER — Encounter: Payer: Self-pay | Admitting: *Deleted

## 2021-05-18 DIAGNOSIS — O2300 Infections of kidney in pregnancy, unspecified trimester: Secondary | ICD-10-CM | POA: Insufficient documentation

## 2021-05-18 DIAGNOSIS — Z789 Other specified health status: Secondary | ICD-10-CM | POA: Insufficient documentation

## 2021-05-18 DIAGNOSIS — N289 Disorder of kidney and ureter, unspecified: Secondary | ICD-10-CM | POA: Insufficient documentation

## 2021-05-18 DIAGNOSIS — O0990 Supervision of high risk pregnancy, unspecified, unspecified trimester: Secondary | ICD-10-CM | POA: Insufficient documentation

## 2021-05-31 ENCOUNTER — Inpatient Hospital Stay (HOSPITAL_COMMUNITY)
Admission: AD | Admit: 2021-05-31 | Discharge: 2021-05-31 | Disposition: A | Discharge status: Home / Self Care | Payer: Medicaid Other | Attending: Obstetrics and Gynecology | Admitting: Obstetrics and Gynecology

## 2021-05-31 ENCOUNTER — Ambulatory Visit (INDEPENDENT_AMBULATORY_CARE_PROVIDER_SITE_OTHER): Payer: Medicaid Other

## 2021-05-31 ENCOUNTER — Encounter (HOSPITAL_COMMUNITY): Payer: Self-pay | Admitting: Obstetrics and Gynecology

## 2021-05-31 ENCOUNTER — Other Ambulatory Visit: Payer: Self-pay

## 2021-05-31 VITALS — BP 147/81 | HR 101 | Temp 98.6°F

## 2021-05-31 DIAGNOSIS — O219 Vomiting of pregnancy, unspecified: Secondary | ICD-10-CM | POA: Insufficient documentation

## 2021-05-31 DIAGNOSIS — R102 Pelvic and perineal pain: Secondary | ICD-10-CM | POA: Diagnosis not present

## 2021-05-31 DIAGNOSIS — R6883 Chills (without fever): Secondary | ICD-10-CM | POA: Diagnosis not present

## 2021-05-31 DIAGNOSIS — N289 Disorder of kidney and ureter, unspecified: Secondary | ICD-10-CM

## 2021-05-31 DIAGNOSIS — O30041 Twin pregnancy, dichorionic/diamniotic, first trimester: Secondary | ICD-10-CM | POA: Diagnosis not present

## 2021-05-31 DIAGNOSIS — O26811 Pregnancy related exhaustion and fatigue, first trimester: Secondary | ICD-10-CM

## 2021-05-31 DIAGNOSIS — Z789 Other specified health status: Secondary | ICD-10-CM

## 2021-05-31 DIAGNOSIS — Z20822 Contact with and (suspected) exposure to covid-19: Secondary | ICD-10-CM | POA: Diagnosis not present

## 2021-05-31 DIAGNOSIS — O0990 Supervision of high risk pregnancy, unspecified, unspecified trimester: Secondary | ICD-10-CM

## 2021-05-31 DIAGNOSIS — O26899 Other specified pregnancy related conditions, unspecified trimester: Secondary | ICD-10-CM | POA: Diagnosis not present

## 2021-05-31 DIAGNOSIS — Z3A10 10 weeks gestation of pregnancy: Secondary | ICD-10-CM | POA: Insufficient documentation

## 2021-05-31 DIAGNOSIS — O26891 Other specified pregnancy related conditions, first trimester: Secondary | ICD-10-CM | POA: Insufficient documentation

## 2021-05-31 DIAGNOSIS — O2301 Infections of kidney in pregnancy, first trimester: Secondary | ICD-10-CM

## 2021-05-31 LAB — CBC WITH DIFFERENTIAL/PLATELET
Abs Immature Granulocytes: 0.02 10*3/uL (ref 0.00–0.07)
Basophils Absolute: 0 10*3/uL (ref 0.0–0.1)
Basophils Relative: 0 %
Eosinophils Absolute: 0 10*3/uL (ref 0.0–0.5)
Eosinophils Relative: 0 %
HCT: 31.6 % — ABNORMAL LOW (ref 36.0–46.0)
Hemoglobin: 10.6 g/dL — ABNORMAL LOW (ref 12.0–15.0)
Immature Granulocytes: 0 %
Lymphocytes Relative: 25 %
Lymphs Abs: 2 10*3/uL (ref 0.7–4.0)
MCH: 25.6 pg — ABNORMAL LOW (ref 26.0–34.0)
MCHC: 33.5 g/dL (ref 30.0–36.0)
MCV: 76.3 fL — ABNORMAL LOW (ref 80.0–100.0)
Monocytes Absolute: 0.4 10*3/uL (ref 0.1–1.0)
Monocytes Relative: 5 %
Neutro Abs: 5.4 10*3/uL (ref 1.7–7.7)
Neutrophils Relative %: 70 %
Platelets: 330 10*3/uL (ref 150–400)
RBC: 4.14 MIL/uL (ref 3.87–5.11)
RDW: 14.6 % (ref 11.5–15.5)
WBC: 7.7 10*3/uL (ref 4.0–10.5)
nRBC: 0 % (ref 0.0–0.2)

## 2021-05-31 LAB — URINALYSIS, ROUTINE W REFLEX MICROSCOPIC
Bilirubin Urine: NEGATIVE
Glucose, UA: NEGATIVE mg/dL
Hgb urine dipstick: NEGATIVE
Ketones, ur: NEGATIVE mg/dL
Nitrite: NEGATIVE
Protein, ur: NEGATIVE mg/dL
Specific Gravity, Urine: 1.005 (ref 1.005–1.030)
pH: 8 (ref 5.0–8.0)

## 2021-05-31 LAB — RESP PANEL BY RT-PCR (FLU A&B, COVID) ARPGX2
Influenza A by PCR: NEGATIVE
Influenza B by PCR: NEGATIVE
SARS Coronavirus 2 by RT PCR: NEGATIVE

## 2021-05-31 MED ORDER — BLOOD PRESSURE MONITORING DEVI: 1.0000 | 0 refills | Status: DC

## 2021-05-31 NOTE — MAU Note (Signed)
Pt reports she has been feeling sick . See feels dizzy and has vomited up whenever she eats. Has been feeling tired since yesterday.  C/o headache and body ache and chills no fever. Took tylenol yesterday but did not help with headache or body ache.

## 2021-05-31 NOTE — Patient Instructions (Signed)
  At our [facility], we work as an integrated team, providing care to address both physical and emotional health. Your medical provider may refer you to see our Behavioral Health Clinician (BHC) on the same day you see your medical provider, as availability permits; often scheduled virtually at your convenience.  Our BHC is available to all patients, visits generally last between 20-30 minutes, but can be longer or shorter, depending on patient need. The BHC offers help with stress management, coping with symptoms of depression and anxiety, major life changes , sleep issues, changing risky behavior, grief and loss, life stress, working on personal life goals, and  behavioral health issues, as these all affect your overall health and wellness.  The BHC is NOT available for the following: FMLA paperwork, court-ordered evaluations, specialty assessments (custody or disability), letters to employers, or obtaining certification for an emotional support animal. The BHC does not provide long-term therapy. You have the right to refuse integrated behavioral health services, or to reschedule to see the BHC at a later date.  Confidentiality exception: If it is suspected that a child or disabled adult is being abused or neglected, we are required by law to report that to either Child Protective Services or Adult Protective Services.  If you have a diagnosis of Bipolar affective disorder, Schizophrenia, or recurrent Major depressive disorder, we will recommend that you establish care with a psychiatrist, as these are lifelong, chronic conditions, and we want your overall emotional health and medications to be more closely monitored. If you anticipate needing extended maternity leave due to mental health issues postpartum, it it recommended you inform your medical provider, so we can put in a referral to a psychiatrist as soon as possible. The BHC is unable to recommend an extended maternity leave for mental  health issues. Your medical provider or BHC may refer you to a therapist for ongoing, traditional therapy, or to a psychiatrist, for medication management, if it would benefit your overall health. Depending on your insurance, you may have a copay or be charged a deductible, depending on your insurance, to see the BHC. If you are uninsured, it is recommended that you apply for financial assistance. (Forms may be requested at the front desk for in-person visits, via MyChart, or request a form during a virtual visit).  If you see the BHC more than 6 times, you will have to complete a comprehensive clinical assessment interview with the BHC to resume integrated services.  For virtual visits with the BHC, you must be physically in the state of [place] at the time of the visit. For example, if you live in [place], you will have to do an in-person visit with the BHC, and your out-of-state insurance may not cover behavioral health services in [place]. If you are going out of the state or country for any reason, the BHC may see you virtually when you return to [place], but not while you are physically outside of [place].    

## 2021-05-31 NOTE — Progress Notes (Signed)
Pt came in today for her New OB Intake stating that she was hurting all over, having headaches/ dizziness since yesterday. Pt states took Tylenol did not help, Pt advised that she has not been around anyone that has been sick.Her BP today is 147/81 & temp: 98.6. Consulted with Dr. Ephriam Jenkins, went over symptoms, she suggested for Pt to go to MAU. Advised Pt she verbalized understanding,called charge Nurse & gave Pt info.

## 2021-05-31 NOTE — MAU Provider Note (Signed)
History     CSN: [id number]  Arrival date and time: 05/31/21 1057   Event Date/Time   First Provider Initiated Contact with Patient 05/31/21 1158      Chief Complaint  Patient presents with   Dizziness   Fatigue   Emesis   Dizziness Associated symptoms include chills, fatigue, myalgias, nausea and vomiting. Pertinent negatives include no abdominal pain, chest pain, congestion, coughing or fever.  Emesis  Associated symptoms include chills, dizziness and myalgias. Pertinent negatives include no abdominal pain, chest pain, coughing, diarrhea or fever.  Ms. Shaylei Hearst Sarajane Marek is a 26 y.o. [phone] at [redacted]w[redacted]d who presents to MAU today with complaint of HA, body aches, dizziness, fatigue and N/V x 2 days. All symptoms came on at same time when she woke up yesterday morning. Patient has a h/o migraines and notes this HA is different, describes throbbing sensation on sides and forehead. Dizziness limited to when she is standing/walking, denies dizziness when laying down. Has vomited twice, took Phenergan yesterday which was helpful for her N/V and dizziness. Denies chest pain, SOB, abdominal pain, diarrhea, and blood in stool.  Patient reports this feels similar to her pyelonephritis that she was seen for a couple weeks ago. Denies dysuria, frequency, urgency, but positive left CVAT. Has had COVID vaccination but not flu vaccine, has not tested for COVID at home. No known sick contacts.  OB History     Gravida  3   Para  2   Term  1   Preterm  1   AB      Living  2      SAB      IAB      Ectopic      Multiple  0   Live Births  2           Past Medical History:  Diagnosis Date   Chest pain    Headache    SOB (shortness of breath)    Tachycardia     Past Surgical History:  Procedure Laterality Date   NO PAST SURGERIES      History reviewed. No pertinent family history.  Social History   Tobacco Use   Smoking status: Never   Smokeless tobacco: Never   Substance Use Topics   Alcohol use: No   Drug use: No    Allergies: No Known Allergies  Medications Prior to Admission  Medication Sig Dispense Refill Last Dose   Prenatal Vit-Fe Fumarate-FA (MULTIVITAMIN-PRENATAL) 27-0.8 MG TABS tablet Take 1 tablet by mouth daily at 12 noon.   05/30/2021   promethazine (PHENERGAN) 25 MG tablet Take 1 tablet (25 mg total) by mouth every 6 (six) hours as needed for nausea or vomiting. 20 tablet 0 05/30/2021   acetaminophen (TYLENOL) 325 MG tablet Take 650 mg by mouth every 6 (six) hours as needed.      cephALEXin (KEFLEX) 500 MG capsule Take 1 capsule (500 mg total) by mouth 4 (four) times daily. 40 capsule 0    norethindrone (MICRONOR) 0.35 MG tablet Take 1 tablet by mouth daily.       Review of Systems  Constitutional:  Positive for chills and fatigue. Negative for fever.  HENT:  Negative for congestion and rhinorrhea.   Eyes:  Negative for photophobia and visual disturbance.  Respiratory:  Negative for cough and shortness of breath.   Cardiovascular:  Negative for chest pain.  Gastrointestinal:  Positive for nausea and vomiting. Negative for abdominal pain, blood in stool and diarrhea.  Genitourinary:  Negative for dysuria, flank pain, urgency, vaginal bleeding and vaginal discharge.  Musculoskeletal:  Positive for myalgias.  Neurological:  Positive for dizziness.  Physical Exam   Blood pressure 134/72, pulse 84, temperature 98.8 F (37.1 C), resp. rate 18, last menstrual period 01/05/2021, unknown if currently breastfeeding.  Physical Exam Constitutional:      Appearance: Normal appearance.  Cardiovascular:     Rate and Rhythm: Normal rate and regular rhythm.     Heart sounds: Normal heart sounds.  Pulmonary:     Effort: Pulmonary effort is normal.     Breath sounds: Normal breath sounds.  Abdominal:     Tenderness: There is abdominal tenderness (Only on deep palpation in LLQ). There is left CVA tenderness.  Skin:    General: Skin is  warm and dry.  Neurological:     Mental Status: She is alert.    MAU Course  Procedures  MDM Clinical presentation patient reports is very similar to pyelonephritis from before.  UA was hazy, trace leukocytes and rare bacteria. Culture pending. CBC WNL, no elevated WBCs or neutrophils. Negative for COVID and Flu.  Assessment and Plan   Round ligament pain  -Abdominal tenderness only on deep palpation in LLQ. Potentially related to twin pregnancy. No other signs of infection found on labs and negative for COVID and flu. Dizziness Nausea/Vomiting  -Advised to continue taking Phenergan as prescribed from previous ED visit to help with N/V and dizziness. Encouraged to eat/drink more fluids to avoid dehydration.  Wilhemina Cash 05/31/2021, 12:28 PM

## 2021-05-31 NOTE — MAU Provider Note (Addendum)
History     CSN: [id number]  Arrival date and time: 05/31/21 1057   Event Date/Time   First Provider Initiated Contact with Patient 05/31/21 1158      Chief Complaint  Patient presents with   Dizziness   Fatigue   Emesis   HPI [name] is a 26 y.o. year old G61P1102 female at [redacted]w[redacted]d weeks gestation who was sent to MAU from [facility] reporting H/A, dizziness, fatigue, generalized body aches, N/V x 2 days. She reports all of her sx's started when she woke up yesterday morning. She has a h/o migraines and reports this H/A is not like those. She describes the H/A as throbbing on the sides of forehead. She only gets dizzy when standing or walking. She has vomited twice since yesterday; relieved with taking Phenergan. She denies CP, SOB. She has had no recent sick contacts; although, her daughter had a URI last week. Her pregnancy is complicated by di-di twins. She receives [facility] with [facility]; next appt is 06/14/2021.  OB History     Gravida  3   Para  2   Term  1   Preterm  1   AB      Living  2      SAB      IAB      Ectopic      Multiple  0   Live Births  2           Past Medical History:  Diagnosis Date   Chest pain    Headache    SOB (shortness of breath)    Tachycardia     Past Surgical History:  Procedure Laterality Date   NO PAST SURGERIES      History reviewed. No pertinent family history.  Social History   Tobacco Use   Smoking status: Never   Smokeless tobacco: Never  Substance Use Topics   Alcohol use: No   Drug use: No    Allergies: No Known Allergies  Medications Prior to Admission  Medication Sig Dispense Refill Last Dose   Prenatal Vit-Fe Fumarate-FA (MULTIVITAMIN-PRENATAL) 27-0.8 MG TABS tablet Take 1 tablet by mouth daily at 12 noon.   05/30/2021   promethazine (PHENERGAN) 25 MG tablet Take 1 tablet (25 mg total) by mouth every 6 (six) hours as needed for nausea or vomiting. 20 tablet 0 05/30/2021   acetaminophen (TYLENOL)  325 MG tablet Take 650 mg by mouth every 6 (six) hours as needed.      cephALEXin (KEFLEX) 500 MG capsule Take 1 capsule (500 mg total) by mouth 4 (four) times daily. 40 capsule 0    norethindrone (MICRONOR) 0.35 MG tablet Take 1 tablet by mouth daily.       Review of Systems  Constitutional:  Positive for fatigue.  HENT: Negative.    Eyes: Negative.   Respiratory: Negative.    Cardiovascular: Negative.   Gastrointestinal: Negative.   Endocrine: Negative.   Genitourinary:  Positive for pelvic pain.  Musculoskeletal:  Positive for back pain.  Skin: Negative.   Allergic/Immunologic: Negative.   Neurological:  Positive for dizziness.  Hematological: Negative.   Psychiatric/Behavioral: Negative.    Physical Exam   Blood pressure 134/72, pulse 84, temperature 98.8 F (37.1 C), resp. rate 18, last menstrual period 01/05/2021.  Physical Exam Vitals and nursing note reviewed.  Constitutional:      Appearance: Normal appearance. She is obese.  Cardiovascular:     Rate and Rhythm: Normal rate.  Pulmonary:  Effort: Pulmonary effort is normal.  Abdominal:     Palpations: Abdomen is soft.  Genitourinary:    Comments: deferred Musculoskeletal:        General: Normal range of motion.  Skin:    General: Skin is warm and dry.  Neurological:     Mental Status: She is alert and oriented to person, place, and time.  Psychiatric:        Mood and Affect: Mood normal.        Behavior: Behavior normal.        Thought Content: Thought content normal.        Judgment: Judgment normal.    MAU Course  Procedures  MDM [facility] UCx -- Results pending COVID/Flu test  *Consult with Dr. Alysia Pearson @ 1401 - notified of patient's complaints, assessments, lab results, recommended tx plan d/c home await UCx results - ok to d/c home without further work-up  Results for orders placed or performed during the hospital encounter of 05/31/21 (from the past 24 hour(s))  Urinalysis, Routine w reflex  microscopic Urine, Clean Catch     Status: Abnormal   Collection Time: 05/31/21 11:40 AM  Result Value Ref Range   Color, Urine YELLOW YELLOW   APPearance HAZY (A) CLEAR   Specific Gravity, Urine 1.005 1.005 - 1.030   pH 8.0 5.0 - 8.0   Glucose, UA NEGATIVE NEGATIVE mg/dL   Hgb urine dipstick NEGATIVE NEGATIVE   Bilirubin Urine NEGATIVE NEGATIVE   Ketones, ur NEGATIVE NEGATIVE mg/dL   Protein, ur NEGATIVE NEGATIVE mg/dL   Nitrite NEGATIVE NEGATIVE   Leukocytes,Ua TRACE (A) NEGATIVE   RBC / HPF 0-5 0 - 5 RBC/hpf   WBC, UA 0-5 0 - 5 WBC/hpf   Bacteria, UA RARE (A) NONE SEEN   Squamous Epithelial / LPF 0-5 0 - 5  Resp Panel by RT-PCR (Flu A&B, Covid) Nasopharyngeal Swab     Status: None   Collection Time: 05/31/21 12:30 PM   Specimen: Nasopharyngeal Swab; Nasopharyngeal(NP) swabs in vial transport medium  Result Value Ref Range   SARS Coronavirus 2 by RT PCR NEGATIVE NEGATIVE   Influenza A by PCR NEGATIVE NEGATIVE   Influenza B by PCR NEGATIVE NEGATIVE  CBC with Differential/Platelet     Status: Abnormal   Collection Time: 05/31/21  1:16 PM  Result Value Ref Range   WBC 7.7 4.0 - 10.5 K/uL   RBC 4.14 3.87 - 5.11 MIL/uL   Hemoglobin 10.6 (L) 12.0 - 15.0 g/dL   HCT 32.1 (L) 22.4 - 82.5 %   MCV 76.3 (L) 80.0 - 100.0 fL   MCH 25.6 (L) 26.0 - 34.0 pg   MCHC 33.5 30.0 - 36.0 g/dL   RDW 00.3 70.4 - 88.8 %   Platelets 330 150 - 400 K/uL   nRBC 0.0 0.0 - 0.2 %   Neutrophils Relative % 70 %   Neutro Abs 5.4 1.7 - 7.7 K/uL   Lymphocytes Relative 25 %   Lymphs Abs 2.0 0.7 - 4.0 K/uL   Monocytes Relative 5 %   Monocytes Absolute 0.4 0.1 - 1.0 K/uL   Eosinophils Relative 0 %   Eosinophils Absolute 0.0 0.0 - 0.5 K/uL   Basophils Relative 0 %   Basophils Absolute 0.0 0.0 - 0.1 K/uL   Immature Granulocytes 0 %   Abs Immature Granulocytes 0.02 0.00 - 0.07 K/uL    Assessment and Plan  Pain of round ligament affecting pregnancy, antepartum  - Information provided on RLP and comfort  measures for RLP   Fatigue during pregnancy in first trimester - Advised to increase rest over the next few days - Recommend increasing daily water intake  Language barrier affecting health care  - [place] Pearson in-house Spanish Interpreter used for initial encounter - [employer] Video Spanish Interpreter, Sue Lushndrea [phone] used for discharge instructions  [redacted] weeks gestation of pregnancy  - Discharge patient - Keep scheduled appt with [facility] on 06/14/2021 - Patient verbalized an understanding of the plan of care and agrees.    Raelyn Moraolitta Ayzia Day, CNM 05/31/2021, 11:58 AM

## 2021-05-31 NOTE — Progress Notes (Signed)
New OB Intake  I connected with  Virginia Pearson on 05/31/21 at 10:15 AM EST by MyChart Video Visit and verified that I am speaking with the correct person using two identifiers. Nurse is located at [facility] and pt is located at [employer] IN PERSON VISIT.  I discussed the limitations, risks, security and privacy concerns of performing an evaluation and management service by telephone and the availability of in person appointments. I also discussed with the patient that there may be a patient responsible charge related to this service. The patient expressed understanding and agreed to proceed.  I explained I am completing New OB Intake today. We discussed her EDD of 12/24/21 that is based on U/S on 05/11/21. Pt is G3/P2. I reviewed her allergies, medications, Medical/Surgical/OB history, and appropriate screenings. I informed her of [facility] services. Based on history, this is a/an  pregnancy complicated by Twins  .   Patient Active Problem List   Diagnosis Date Noted   Pyelonephritis complicating pregnancy 05/18/2021   Kidney dysfunction 05/18/2021   Supervision of high risk pregnancy, antepartum 05/18/2021   Language barrier 05/18/2021   Chest pain of uncertain etiology 03/03/2020   Normal labor 02/26/2019    Concerns addressed today  Delivery Plans:  Plans to deliver at [facility].   MyChart/Babyscripts MyChart access verified. I explained pt will have some visits in office and some virtually. Babyscripts instructions given and order placed. Patient verifies receipt of registration text/e-mail. Account successfully created and app downloaded.  Blood Pressure Cuff  Blood pressure cuff ordered for patient to pick-up from [employer]. Explained after first prenatal appt pt will check weekly and document in Babyscripts.  Weight scale: Patient does / does not  have weight scale. Weight scale ordered for patient to pick up from [employer].   Anatomy US Explained first scheduled [place] will  be around 19 weeks. Anatomy US scheduled for 08/01/21 at 12:45a. Pt notified to arrive at 12:30a.  Labs Discussed Avelina Laine genetic screening with patient. Would like both Panorama and Horizon drawn at new OB visit. Routine prenatal labs needed.  Covid Vaccine Patient has not covid vaccine.   CenteringPregnancy Candidate?  If yes, offer as possibility  Mother/ Baby Dyad Candidate?    If yes, offer as possibility  Informed patient of [facility] website  and placed link in her AVS.   Social Determinants of Health Food Insecurity: Patient denies food insecurity. WIC Referral: Patient is interested in referral to [facility].  Transportation: Patient denies transportation needs. Childcare: Discussed no children allowed at ultrasound appointments. Offered childcare services; patient declines childcare services at this time.  Send link to Pregnancy Navigators   Placed OB Box on problem list and updated  First visit review I reviewed new OB appt with pt. I explained she will have a pelvic exam, ob bloodwork with genetic screening, and PAP smear. Explained pt will be seen by Dr. Crissie Reese at first visit; encounter routed to appropriate provider. Explained that patient will be seen by pregnancy navigator following visit with provider. [facility] information placed in AVS.   Henrietta Dine, CMA 05/31/2021  10:02 AM

## 2021-05-31 NOTE — Discharge Instructions (Signed)
Medidas de comodidad para el dolor del ligamento redondo:  Sumrjase en una tina caliente Tylenol 1000 mg por va oral cada 6 a 8 horas segn sea necesario para el dolor Cambios lentos de posicin Haz masajes plvicos y estiramientos en silla (como te lo demostraron) Acostado sobre el lado afectado  

## 2021-06-01 LAB — CULTURE, OB URINE

## 2021-06-09 ENCOUNTER — Inpatient Hospital Stay (HOSPITAL_COMMUNITY): Payer: Medicaid Other

## 2021-06-09 ENCOUNTER — Inpatient Hospital Stay (HOSPITAL_COMMUNITY)
Admission: AD | Admit: 2021-06-09 | Discharge: 2021-06-09 | Disposition: A | Discharge status: Home / Self Care | Payer: Medicaid Other | Attending: Obstetrics & Gynecology | Admitting: Obstetrics & Gynecology

## 2021-06-09 ENCOUNTER — Other Ambulatory Visit: Payer: Self-pay

## 2021-06-09 DIAGNOSIS — N289 Disorder of kidney and ureter, unspecified: Secondary | ICD-10-CM

## 2021-06-09 DIAGNOSIS — O0990 Supervision of high risk pregnancy, unspecified, unspecified trimester: Secondary | ICD-10-CM

## 2021-06-09 DIAGNOSIS — O99891 Other specified diseases and conditions complicating pregnancy: Secondary | ICD-10-CM | POA: Diagnosis not present

## 2021-06-09 DIAGNOSIS — R1032 Left lower quadrant pain: Secondary | ICD-10-CM | POA: Insufficient documentation

## 2021-06-09 DIAGNOSIS — O26891 Other specified pregnancy related conditions, first trimester: Secondary | ICD-10-CM | POA: Insufficient documentation

## 2021-06-09 DIAGNOSIS — Z789 Other specified health status: Secondary | ICD-10-CM

## 2021-06-09 DIAGNOSIS — O2301 Infections of kidney in pregnancy, first trimester: Secondary | ICD-10-CM | POA: Diagnosis not present

## 2021-06-09 DIAGNOSIS — N1330 Unspecified hydronephrosis: Secondary | ICD-10-CM | POA: Insufficient documentation

## 2021-06-09 DIAGNOSIS — Z3A11 11 weeks gestation of pregnancy: Secondary | ICD-10-CM | POA: Diagnosis not present

## 2021-06-09 LAB — COMPREHENSIVE METABOLIC PANEL
ALT: 17 U/L (ref 0–44)
AST: 14 U/L — ABNORMAL LOW (ref 15–41)
Albumin: 2.9 g/dL — ABNORMAL LOW (ref 3.5–5.0)
Alkaline Phosphatase: 79 U/L (ref 38–126)
Anion gap: 10 (ref 5–15)
BUN: 8 mg/dL (ref 6–20)
CO2: 23 mmol/L (ref 22–32)
Calcium: 9.1 mg/dL (ref 8.9–10.3)
Chloride: 101 mmol/L (ref 98–111)
Creatinine, Ser: 0.53 mg/dL (ref 0.44–1.00)
GFR, Estimated: >60 mL/min (ref >60)
Glucose, Bld: 100 mg/dL — ABNORMAL HIGH (ref 70–99)
Potassium: 3.7 mmol/L (ref 3.5–5.1)
Sodium: 134 mmol/L — ABNORMAL LOW (ref 135–145)
Total Bilirubin: 0.3 mg/dL (ref 0.3–1.2)
Total Protein: 6.7 g/dL (ref 6.5–8.1)

## 2021-06-09 LAB — URINALYSIS, MICROSCOPIC (REFLEX): WBC, UA: >50 WBC/hpf (ref 0–5)

## 2021-06-09 LAB — URINALYSIS, ROUTINE W REFLEX MICROSCOPIC
Bilirubin Urine: NEGATIVE
Glucose, UA: NEGATIVE mg/dL
Ketones, ur: NEGATIVE mg/dL
Nitrite: NEGATIVE
Protein, ur: NEGATIVE mg/dL
Specific Gravity, Urine: 1.015 (ref 1.005–1.030)
pH: 6.5 (ref 5.0–8.0)

## 2021-06-09 LAB — CBC
HCT: 29.4 % — ABNORMAL LOW (ref 36.0–46.0)
Hemoglobin: 10.1 g/dL — ABNORMAL LOW (ref 12.0–15.0)
MCH: 26.2 pg (ref 26.0–34.0)
MCHC: 34.4 g/dL (ref 30.0–36.0)
MCV: 76.4 fL — ABNORMAL LOW (ref 80.0–100.0)
Platelets: 297 10*3/uL (ref 150–400)
RBC: 3.85 MIL/uL — ABNORMAL LOW (ref 3.87–5.11)
RDW: 14.4 % (ref 11.5–15.5)
WBC: 11.1 10*3/uL — ABNORMAL HIGH (ref 4.0–10.5)
nRBC: 0 % (ref 0.0–0.2)

## 2021-06-09 MED ORDER — ACETAMINOPHEN 500 MG PO TABS
1000.0000 mg | ORAL_TABLET | Freq: Four times a day (QID) | ORAL | Status: DC | PRN
  Administered 2021-06-09: 1000 mg via ORAL
  Filled 2021-06-09: qty 2

## 2021-06-09 MED ORDER — CYCLOBENZAPRINE HCL 5 MG PO TABS
10.0000 mg | ORAL_TABLET | Freq: Once | ORAL | Status: AC
  Administered 2021-06-09: 10 mg via ORAL
  Filled 2021-06-09: qty 2

## 2021-06-09 MED ORDER — CYCLOBENZAPRINE HCL 10 MG PO TABS: 10.0000 mg | ORAL_TABLET | Freq: Three times a day (TID) | ORAL | 0 refills | Status: DC | PRN

## 2021-06-09 NOTE — MAU Provider Note (Signed)
History     CSN: [id number]  Arrival date and time: 06/09/21 1916   Event Date/Time   First Provider Initiated Contact with Patient 06/09/21 2012      Chief Complaint  Patient presents with   Abdominal Pain   26 y.o. [id number] @11 .5 wks with twins presenting with left flank pain and LLQ pain. Pain started 3 days ago. Pain is intermittent. Rates 5/10. She took Tylenol that helped a little. Denies VB. Reports urinary frequency but denies other urinary sx. Reports lifting laundry bags, denies recent injuries. States she was recently treat for a UTI and completed all abx. Reports daily N/V usually in the am, is tolerating food and fluids.    OB History     Gravida  3   Para  2   Term  1   Preterm  1   AB      Living  2      SAB      IAB      Ectopic      Multiple  0   Live Births  2           Past Medical History:  Diagnosis Date   Chest pain    Headache    SOB (shortness of breath)    Tachycardia     Past Surgical History:  Procedure Laterality Date   NO PAST SURGERIES      No family history on file.  Social History   Tobacco Use   Smoking status: Never   Smokeless tobacco: Never  Substance Use Topics   Alcohol use: No   Drug use: No    Allergies: No Known Allergies  Medications Prior to Admission  Medication Sig Dispense Refill Last Dose   acetaminophen (TYLENOL) 325 MG tablet Take 650 mg by mouth every 6 (six) hours as needed.      Blood Pressure Monitoring DEVI 1 each by Does not apply route once a week. 1 each 0    Prenatal Vit-Fe Fumarate-FA (MULTIVITAMIN-PRENATAL) 27-0.8 MG TABS tablet Take 1 tablet by mouth daily at 12 noon.      promethazine (PHENERGAN) 25 MG tablet Take 1 tablet (25 mg total) by mouth every 6 (six) hours as needed for nausea or vomiting. 20 tablet 0     Review of Systems  Constitutional:  Negative for fever.  Gastrointestinal:  Positive for nausea and vomiting. Negative for constipation and diarrhea.   Genitourinary:  Positive for flank pain and frequency. Negative for dysuria, hematuria, urgency, vaginal bleeding and vaginal discharge.  Physical Exam   Blood pressure 127/65, pulse (!) 106, temperature 98.4 F (36.9 C), temperature source Oral, resp. rate 20, height 5' (1.524 m), weight 103.6 kg, last menstrual period 01/05/2021, unknown if currently breastfeeding.  Physical Exam Vitals and nursing note reviewed.  Constitutional:      General: She is not in acute distress.    Appearance: Normal appearance.  HENT:     Head: Normocephalic and atraumatic.  Cardiovascular:     Rate and Rhythm: Normal rate.  Pulmonary:     Effort: Pulmonary effort is normal. No respiratory distress.  Abdominal:     General: There is no distension.     Palpations: Abdomen is soft. There is no mass.     Tenderness: There is no abdominal tenderness. There is no guarding or rebound.     Hernia: No hernia is present.  Musculoskeletal:        General: Normal range of motion.  Cervical back: Normal range of motion.  Skin:    General: Skin is warm and dry.  Neurological:     General: No focal deficit present.     Mental Status: She is alert and oriented to person, place, and time.  Psychiatric:        Mood and Affect: Mood normal.        Behavior: Behavior normal.   Results for orders placed or performed during the hospital encounter of 06/09/21 (from the past 24 hour(s))  Urinalysis, Routine w reflex microscopic Urine, Clean Catch     Status: Abnormal   Collection Time: 06/09/21  7:54 PM  Result Value Ref Range   Color, Urine YELLOW YELLOW   APPearance CLOUDY (A) CLEAR   Specific Gravity, Urine 1.015 1.005 - 1.030   pH 6.5 5.0 - 8.0   Glucose, UA NEGATIVE NEGATIVE mg/dL   Hgb urine dipstick SMALL (A) NEGATIVE   Bilirubin Urine NEGATIVE NEGATIVE   Ketones, ur NEGATIVE NEGATIVE mg/dL   Protein, ur NEGATIVE NEGATIVE mg/dL   Nitrite NEGATIVE NEGATIVE   Leukocytes,Ua LARGE (A) NEGATIVE   Urinalysis, Microscopic (reflex)     Status: Abnormal   Collection Time: 06/09/21  7:54 PM  Result Value Ref Range   RBC / HPF 0-5 0 - 5 RBC/hpf   WBC, UA >50 0 - 5 WBC/hpf   Bacteria, UA FEW (A) NONE SEEN   Squamous Epithelial / LPF 6-10 0 - 5   WBC Clumps PRESENT    Mucus PRESENT   CBC     Status: Abnormal   Collection Time: 06/09/21  8:40 PM  Result Value Ref Range   WBC 11.1 (H) 4.0 - 10.5 K/uL   RBC 3.85 (L) 3.87 - 5.11 MIL/uL   Hemoglobin 10.1 (L) 12.0 - 15.0 g/dL   HCT 29.4 (L) 36.0 - 46.0 %   MCV 76.4 (L) 80.0 - 100.0 fL   MCH 26.2 26.0 - 34.0 pg   MCHC 34.4 30.0 - 36.0 g/dL   RDW 14.4 11.5 - 15.5 %   Platelets 297 150 - 400 K/uL   nRBC 0.0 0.0 - 0.2 %  Comprehensive metabolic panel     Status: Abnormal   Collection Time: 06/09/21  8:40 PM  Result Value Ref Range   Sodium 134 (L) 135 - 145 mmol/L   Potassium 3.7 3.5 - 5.1 mmol/L   Chloride 101 98 - 111 mmol/L   CO2 23 22 - 32 mmol/L   Glucose, Bld 100 (H) 70 - 99 mg/dL   BUN 8 6 - 20 mg/dL   Creatinine, Ser 0.53 0.44 - 1.00 mg/dL   Calcium 9.1 8.9 - 10.3 mg/dL   Total Protein 6.7 6.5 - 8.1 g/dL   Albumin 2.9 (L) 3.5 - 5.0 g/dL   AST 14 (L) 15 - 41 U/L   ALT 17 0 - 44 U/L   Alkaline Phosphatase 79 38 - 126 U/L   Total Bilirubin 0.3 0.3 - 1.2 mg/dL   GFR, Estimated >60 >60 mL/min   Anion gap 10 5 - 15   US Renal  Result Date: 06/09/2021 CLINICAL DATA:  Left flank pain EXAM: RENAL / URINARY TRACT ULTRASOUND COMPLETE COMPARISON:  None. FINDINGS: Right Kidney: Renal measurements: 10.4 x 4.4 x 4.4 cm = volume: 106 mL. Echogenicity within normal limits. No mass or hydronephrosis visualized. Left Kidney: Renal measurements: 14.0 x 5.4 x 5.8 cm = volume: 228 mL. Normal renal echotexture. Mild to moderate left-sided hydronephrosis and proximal left hydroureter.  No evidence of nephrolithiasis. Bladder: The right ureteral jet is well visualized. The left ureteral jet is not seen. No filling defects within the bladder. Other:  None. IMPRESSION: 1. Mild to moderate left-sided hydronephrosis and proximal left hydroureter, without clear cause for obstruction identified. No evidence of nephrolithiasis. 2. Otherwise unremarkable appearance of the kidneys. Electronically Signed   By: Randa Ngo M.D.   On: 06/09/2021 21:18    MAU Course  Procedures Flexeril Tylenol  MDM Labs and imaging ordered and reviewed. Normal renal fxn. UA with bacteria and WBCs, culture ordered. Hydronephrosis seen on [place], no signs of stone. Pain improved with meds. Consult with Dr. Roselie Awkward, ok for discharge home.   Assessment and Plan  11 week twin pregnancy Hydronephrosis Flank pain Discharge home Follow up at [facility] as scheduled next week Rx Flexeril Tylenol prn Return precautions  Allergies as of 06/09/2021   No Known Allergies      Medication List     TAKE these medications    acetaminophen 325 MG tablet Commonly known as: TYLENOL Take 650 mg by mouth every 6 (six) hours as needed.   Blood Pressure Monitoring Devi 1 each by Does not apply route once a week.   cyclobenzaprine 10 MG tablet Commonly known as: FLEXERIL Take 1 tablet (10 mg total) by mouth 3 (three) times daily as needed for muscle spasms.   multivitamin-prenatal 27-0.8 MG Tabs tablet Take 1 tablet by mouth daily at 12 noon.   promethazine 25 MG tablet Commonly known as: PHENERGAN Take 1 tablet (25 mg total) by mouth every 6 (six) hours as needed for nausea or vomiting.        Video interpreter used for all interactions Julianne Handler, CNM 06/09/2021, 10:12 PM

## 2021-06-09 NOTE — MAU Note (Addendum)
[facility] WITH CLINIC  SAYS Monday - STARTED HAVING BACK PAIN AND LEFT LOWER ABD PAIN - HURTS TO BEND OVER AND PICK UP SOMETHING  LAST WEEK - HURT VAGINA TO HAVE SEX NO PAIN WITH URINATION TOOK REG TYLENOL 1 TAB - AT 5 PM- SAYS NO RELIEF PT SAYS LEFT KIDNEY  IS SMALLER- AND HAS HX OF KIDNEY INFECTIONS - THIS FEELS LIKE KI HAD KIDNEY INFECTION IN [place]

## 2021-06-10 ENCOUNTER — Inpatient Hospital Stay (HOSPITAL_COMMUNITY): Payer: Medicaid Other

## 2021-06-10 ENCOUNTER — Inpatient Hospital Stay (HOSPITAL_COMMUNITY)
Admission: AD | Admit: 2021-06-10 | Discharge: 2021-06-10 | Disposition: A | Discharge status: Home / Self Care | Payer: Medicaid Other | Attending: Obstetrics and Gynecology | Admitting: Obstetrics and Gynecology

## 2021-06-10 ENCOUNTER — Encounter (HOSPITAL_COMMUNITY): Payer: Self-pay | Admitting: Obstetrics & Gynecology

## 2021-06-10 ENCOUNTER — Telehealth: Payer: Self-pay | Admitting: General Practice

## 2021-06-10 DIAGNOSIS — Z789 Other specified health status: Secondary | ICD-10-CM

## 2021-06-10 DIAGNOSIS — O2341 Unspecified infection of urinary tract in pregnancy, first trimester: Secondary | ICD-10-CM | POA: Insufficient documentation

## 2021-06-10 DIAGNOSIS — Q631 Lobulated, fused and horseshoe kidney: Secondary | ICD-10-CM

## 2021-06-10 DIAGNOSIS — Z3A11 11 weeks gestation of pregnancy: Secondary | ICD-10-CM

## 2021-06-10 DIAGNOSIS — M549 Dorsalgia, unspecified: Secondary | ICD-10-CM | POA: Insufficient documentation

## 2021-06-10 DIAGNOSIS — I10 Essential (primary) hypertension: Secondary | ICD-10-CM

## 2021-06-10 DIAGNOSIS — O2301 Infections of kidney in pregnancy, first trimester: Secondary | ICD-10-CM | POA: Diagnosis present

## 2021-06-10 DIAGNOSIS — R109 Unspecified abdominal pain: Secondary | ICD-10-CM

## 2021-06-10 DIAGNOSIS — R8290 Unspecified abnormal findings in urine: Secondary | ICD-10-CM | POA: Diagnosis not present

## 2021-06-10 DIAGNOSIS — N289 Disorder of kidney and ureter, unspecified: Secondary | ICD-10-CM

## 2021-06-10 DIAGNOSIS — O26891 Other specified pregnancy related conditions, first trimester: Secondary | ICD-10-CM | POA: Diagnosis not present

## 2021-06-10 DIAGNOSIS — O10919 Unspecified pre-existing hypertension complicating pregnancy, unspecified trimester: Secondary | ICD-10-CM

## 2021-06-10 DIAGNOSIS — Z758 Other problems related to medical facilities and other health care: Secondary | ICD-10-CM

## 2021-06-10 DIAGNOSIS — N136 Pyonephrosis: Secondary | ICD-10-CM | POA: Diagnosis not present

## 2021-06-10 DIAGNOSIS — O30041 Twin pregnancy, dichorionic/diamniotic, first trimester: Secondary | ICD-10-CM | POA: Diagnosis not present

## 2021-06-10 DIAGNOSIS — O10911 Unspecified pre-existing hypertension complicating pregnancy, first trimester: Secondary | ICD-10-CM | POA: Diagnosis not present

## 2021-06-10 DIAGNOSIS — O0990 Supervision of high risk pregnancy, unspecified, unspecified trimester: Secondary | ICD-10-CM

## 2021-06-10 MED ORDER — HYDROMORPHONE HCL 1 MG/ML IJ SOLN
1.0000 mg | Freq: Once | INTRAMUSCULAR | Status: AC
  Administered 2021-06-10: 1 mg via INTRAVENOUS
  Filled 2021-06-10: qty 1

## 2021-06-10 MED ORDER — LACTATED RINGERS IV SOLN: Freq: Once | INTRAVENOUS | Status: AC

## 2021-06-10 MED ORDER — ONDANSETRON HCL 4 MG/2ML IJ SOLN
4.0000 mg | Freq: Once | INTRAMUSCULAR | Status: AC
Start: 2021-06-10 — End: 2021-06-10
  Administered 2021-06-10: 4 mg via INTRAVENOUS
  Filled 2021-06-10: qty 2

## 2021-06-10 MED ORDER — OXYCODONE HCL 5 MG PO TABS: 5.0000 mg | ORAL_TABLET | Freq: Four times a day (QID) | ORAL | 0 refills | Status: AC | PRN

## 2021-06-10 MED ORDER — TAMSULOSIN HCL 0.4 MG PO CAPS
0.4000 mg | ORAL_CAPSULE | Freq: Once | ORAL | Status: AC
Start: 2021-06-10 — End: 2021-06-10
  Administered 2021-06-10: 0.4 mg via ORAL
  Filled 2021-06-10: qty 1

## 2021-06-10 MED ORDER — LACTATED RINGERS IV SOLN: INTRAVENOUS | Status: DC

## 2021-06-10 MED ORDER — CEFADROXIL 500 MG PO CAPS
500.0000 mg | ORAL_CAPSULE | Freq: Two times a day (BID) | ORAL | 0 refills | Status: DC
Start: 2021-06-10 — End: 2021-08-10

## 2021-06-10 NOTE — Telephone Encounter (Signed)
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

## 2021-06-10 NOTE — Telephone Encounter (Signed)
Scheduled referral appt with [facility] 2/7 @ 9am. Called patient, no answer- left message to call us back regarding appt for next week. Will send mychart message.

## 2021-06-10 NOTE — MAU Note (Signed)
PT WAS HERE   D/C  TO HOME AT 10PM SAYS PAIN ON LEFT SIDE AND BACK RETURNED

## 2021-06-10 NOTE — MAU Provider Note (Addendum)
Chief Complaint: Back Pain   Event Date/Time   First Provider Initiated Contact with Patient 06/10/21 0501        SUBJECTIVE HPI: Virginia Pearson is a 26 y.o. [id number] with Twin Gestation at [redacted]w[redacted]d by LMP who presents to maternity admissions reporting recurrence of left flank pain.  Was seen earlier and US showed left hydronephrosis and dilation of ureter, but no stone was seen.   She was given Flexeril and Tylenol and discharged home.. Urine culture from last week grew out mixed species. So another culture was sent tonight due to leukocytes and blood in urine. She denies vaginal bleeding, vaginal itching/burning, h/a, dizziness, n/v, or fever/chills.    Back Pain This is a recurrent problem. The problem occurs constantly. The problem has been gradually worsening since onset. Pain location: left flank. The pain does not radiate. The pain is severe. Stiffness is present All day. Pertinent negatives include no abdominal pain, chest pain, dysuria, fever, headaches, pelvic pain or weakness. She has tried analgesics for the symptoms. The treatment provided no relief.  RN note: PT WAS HERE   D/C  TO HOME AT 10PM SAYS PAIN ON LEFT SIDE AND BACK RETURNED   Past Medical History:  Diagnosis Date   Chest pain    Headache    SOB (shortness of breath)    Tachycardia    Past Surgical History:  Procedure Laterality Date   NO PAST SURGERIES     Social History   Socioeconomic History   Marital status: Single    Spouse name: Not on file   Number of children: Not on file   Years of education: Not on file   Highest education level: Not on file  Occupational History   Not on file  Tobacco Use   Smoking status: Never   Smokeless tobacco: Never  Substance and Sexual Activity   Alcohol use: No   Drug use: No   Sexual activity: Yes    Birth control/protection: None  Other Topics Concern   Not on file  Social History Narrative   Not on file   Social Determinants of Health   Financial  Resource Strain: Not on file  Food Insecurity: No Food Insecurity   Worried About Running Out of Food in the Last Year: Never true   Ran Out of Food in the Last Year: Never true  Transportation Needs: No Transportation Needs   Lack of Transportation (Medical): No   Lack of Transportation (Non-Medical): No  Physical Activity: Not on file  Stress: Not on file  Social Connections: Not on file  Intimate Partner Violence: Not on file   No current facility-administered medications on file prior to encounter.   Current Outpatient Medications on File Prior to Encounter  Medication Sig Dispense Refill   acetaminophen (TYLENOL) 325 MG tablet Take 650 mg by mouth every 6 (six) hours as needed.     cyclobenzaprine (FLEXERIL) 10 MG tablet Take 1 tablet (10 mg total) by mouth 3 (three) times daily as needed for muscle spasms. 20 tablet 0   Prenatal Vit-Fe Fumarate-FA (MULTIVITAMIN-PRENATAL) 27-0.8 MG TABS tablet Take 1 tablet by mouth daily at 12 noon.     Blood Pressure Monitoring DEVI 1 each by Does not apply route once a week. 1 each 0   promethazine (PHENERGAN) 25 MG tablet Take 1 tablet (25 mg total) by mouth every 6 (six) hours as needed for nausea or vomiting. 20 tablet 0   No Known Allergies  I have reviewed patient's  Past Medical Hx, Surgical Hx, Family Hx, Social Hx, medications and allergies.   ROS:  Review of Systems  Constitutional:  Negative for fever.  Cardiovascular:  Negative for chest pain.  Gastrointestinal:  Negative for abdominal pain.  Genitourinary:  Negative for dysuria and pelvic pain.  Musculoskeletal:  Positive for back pain.  Neurological:  Negative for weakness and headaches.  Review of Systems  Other systems negative   Physical Exam  Physical Exam Patient Vitals for the past 24 hrs:  BP Temp Temp src Pulse Resp  06/10/21 0500 (!) 127/54 -- -- (!) 116 --  06/10/21 0450 (!) 147/78 -- -- (!) 125 --  06/10/21 0437 138/77 99.9 F (37.7 C) Oral (!) 129 (!) 24    Constitutional: Well-developed, well-nourished female in no acute distress.  Cardiovascular: normal rate Respiratory: normal effort GI: Abd soft, non-tender. Pos BS x 4 MS: Extremities nontender, no edema, normal ROM Neurologic: Alert and oriented x 4.  GU: Neg CVAT.   LAB RESULTS Results for orders placed or performed during the hospital encounter of 06/09/21 (from the past 24 hour(s))  Urinalysis, Routine w reflex microscopic Urine, Clean Catch     Status: Abnormal   Collection Time: 06/09/21  7:54 PM  Result Value Ref Range   Color, Urine YELLOW YELLOW   APPearance CLOUDY (A) CLEAR   Specific Gravity, Urine 1.015 1.005 - 1.030   pH 6.5 5.0 - 8.0   Glucose, UA NEGATIVE NEGATIVE mg/dL   Hgb urine dipstick SMALL (A) NEGATIVE   Bilirubin Urine NEGATIVE NEGATIVE   Ketones, ur NEGATIVE NEGATIVE mg/dL   Protein, ur NEGATIVE NEGATIVE mg/dL   Nitrite NEGATIVE NEGATIVE   Leukocytes,Ua LARGE (A) NEGATIVE  Urinalysis, Microscopic (reflex)     Status: Abnormal   Collection Time: 06/09/21  7:54 PM  Result Value Ref Range   RBC / HPF 0-5 0 - 5 RBC/hpf   WBC, UA >50 0 - 5 WBC/hpf   Bacteria, UA FEW (A) NONE SEEN   Squamous Epithelial / LPF 6-10 0 - 5   WBC Clumps PRESENT    Mucus PRESENT   CBC     Status: Abnormal   Collection Time: 06/09/21  8:40 PM  Result Value Ref Range   WBC 11.1 (H) 4.0 - 10.5 K/uL   RBC 3.85 (L) 3.87 - 5.11 MIL/uL   Hemoglobin 10.1 (L) 12.0 - 15.0 g/dL   HCT 58.5 (L) 27.7 - 82.4 %   MCV 76.4 (L) 80.0 - 100.0 fL   MCH 26.2 26.0 - 34.0 pg   MCHC 34.4 30.0 - 36.0 g/dL   RDW 23.5 36.1 - 44.3 %   Platelets 297 150 - 400 K/uL   nRBC 0.0 0.0 - 0.2 %  Comprehensive metabolic panel     Status: Abnormal   Collection Time: 06/09/21  8:40 PM  Result Value Ref Range   Sodium 134 (L) 135 - 145 mmol/L   Potassium 3.7 3.5 - 5.1 mmol/L   Chloride 101 98 - 111 mmol/L   CO2 23 22 - 32 mmol/L   Glucose, Bld 100 (H) 70 - 99 mg/dL   BUN 8 6 - 20 mg/dL   Creatinine,  Ser 1.54 0.44 - 1.00 mg/dL   Calcium 9.1 8.9 - 00.8 mg/dL   Total Protein 6.7 6.5 - 8.1 g/dL   Albumin 2.9 (L) 3.5 - 5.0 g/dL   AST 14 (L) 15 - 41 U/L   ALT 17 0 - 44 U/L   Alkaline Phosphatase 79  38 - 126 U/L   Total Bilirubin 0.3 0.3 - 1.2 mg/dL   GFR, Estimated >40>60 >98>60 mL/min   Anion gap 10 5 - 15     IMAGING done earlier: US Renal  Result Date: 06/09/2021 CLINICAL DATA:  Left flank pain EXAM: RENAL / URINARY TRACT ULTRASOUND COMPLETE COMPARISON:  None. FINDINGS: Right Kidney: Renal measurements: 10.4 x 4.4 x 4.4 cm = volume: 106 mL. Echogenicity within normal limits. No mass or hydronephrosis visualized. Left Kidney: Renal measurements: 14.0 x 5.4 x 5.8 cm = volume: 228 mL. Normal renal echotexture. Mild to moderate left-sided hydronephrosis and proximal left hydroureter. No evidence of nephrolithiasis. Bladder: The right ureteral jet is well visualized. The left ureteral jet is not seen. No filling defects within the bladder. Other: None. IMPRESSION: 1. Mild to moderate left-sided hydronephrosis and proximal left hydroureter, without clear cause for obstruction identified. No evidence of nephrolithiasis. 2. Otherwise unremarkable appearance of the kidneys. Electronically Signed   By: Sharlet SalinaMichael  Brown M.D.   On: 06/09/2021 21:18     MAU Management/MDM: Ordered IV hydration x 2 liters. Dilaudid and Flowmax given IV with good relief.   Treatments in MAU included IV hydration, analgesia.   I think she may have a stone on the left.  Even though stone not seen, there is hydronephrosis and ureteral dilation.   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  Tonight's US did not mention this (message sent to Radiologist to review)  Also considering possible UTI/pyelo, given UA result. However, similar UA last week only grew our multiple species, so will wait for this culture to come back  ASSESSMENT Single IUP at [id number] Left flank pain Left  hydronephrosis and ureteral dilation Suspect possible stone or UTI  PLAN Report given to [facility] [place] Belva Koziel CNM .    Wynelle BourgeoisMarie Williams CNM, MSN Certified Nurse-Midwife 06/10/2021  6:34 AM  Patient Vitals for the past 24 hrs:  BP Temp Temp src Pulse Resp SpO2  06/10/21 0816 127/65 99.3 F (37.4 C) Oral (!) 115 20 100 %  06/10/21 0500 (!) 127/54 -- -- (!) 116 -- --  06/10/21 0450 (!) 147/78 -- -- (!) 125 -- --  06/10/21 0437 138/77 99.9 F (37.7 C) Oral (!) 129 (!) 24 --   '  Results for orders placed or performed during the hospital encounter of 06/09/21 (from the past 24 hour(s))  Urinalysis, Routine w reflex microscopic Urine, Clean Catch     Status: Abnormal   Collection Time: 06/09/21  7:54 PM  Result Value Ref Range   Color, Urine YELLOW YELLOW   APPearance CLOUDY (A) CLEAR   Specific Gravity, Urine 1.015 1.005 - 1.030   pH 6.5 5.0 - 8.0   Glucose, UA NEGATIVE NEGATIVE mg/dL   Hgb urine dipstick SMALL (A) NEGATIVE   Bilirubin Urine NEGATIVE NEGATIVE   Ketones, ur NEGATIVE NEGATIVE mg/dL   Protein, ur NEGATIVE NEGATIVE mg/dL   Nitrite NEGATIVE NEGATIVE   Leukocytes,Ua LARGE (A) NEGATIVE  Urinalysis, Microscopic (reflex)     Status: Abnormal   Collection Time: 06/09/21  7:54 PM  Result Value Ref Range   RBC / HPF 0-5 0 - 5 RBC/hpf   WBC, UA >50 0 - 5 WBC/hpf   Bacteria, UA FEW (A) NONE SEEN   Squamous Epithelial / LPF 6-10 0 - 5   WBC Clumps PRESENT    Mucus PRESENT   CBC     Status: Abnormal  Collection Time: 06/09/21  8:40 PM  Result Value Ref Range   WBC 11.1 (H) 4.0 - 10.5 K/uL   RBC 3.85 (L) 3.87 - 5.11 MIL/uL   Hemoglobin 10.1 (L) 12.0 - 15.0 g/dL   HCT 96.029.4 (L) 45.436.0 - 09.846.0 %   MCV 76.4 (L) 80.0 - 100.0 fL   MCH 26.2 26.0 - 34.0 pg   MCHC 34.4 30.0 - 36.0 g/dL   RDW 11.914.4 14.711.5 - 82.915.5 %   Platelets 297 150 - 400 K/uL   nRBC 0.0 0.0 - 0.2 %  Comprehensive metabolic panel     Status: Abnormal   Collection Time: 06/09/21  8:40 PM  Result Value Ref Range    Sodium 134 (L) 135 - 145 mmol/L   Potassium 3.7 3.5 - 5.1 mmol/L   Chloride 101 98 - 111 mmol/L   CO2 23 22 - 32 mmol/L   Glucose, Bld 100 (H) 70 - 99 mg/dL   BUN 8 6 - 20 mg/dL   Creatinine, Ser 5.620.53 0.44 - 1.00 mg/dL   Calcium 9.1 8.9 - 13.010.3 mg/dL   Total Protein 6.7 6.5 - 8.1 g/dL   Albumin 2.9 (L) 3.5 - 5.0 g/dL   AST 14 (L) 15 - 41 U/L   ALT 17 0 - 44 U/L   Alkaline Phosphatase 79 38 - 126 U/L   Total Bilirubin 0.3 0.3 - 1.2 mg/dL   GFR, Estimated >86>60 >57>60 mL/min   Anion gap 10 5 - 15   US RENAL  Result Date: 06/10/2021 CLINICAL DATA:  Increasing left flank pain. Known horseshoe kidney. Twin pregnancy. EXAM: RENAL / URINARY TRACT ULTRASOUND COMPLETE COMPARISON:  Renal ultrasound 06/09/2021. Abdominopelvic CT 05/09/2018. FINDINGS: Right Kidney: Renal measurements: 9.1 x 4.0 x 4.5 cm = volume: 85.4 mL. Echogenicity within normal limits. No mass or hydronephrosis visualized. Inferior connection of the lower pole to the left kidney from known horseshoe kidney not well visualized. Left Kidney: Renal measurements: 13.6 x 5.4 x 5.6 cm = volume: 213.2 mL. Echogenicity within normal limits. No mass or hydronephrosis visualized. Small extrarenal pelvis. Inferior connection of the lower pole to the right kidney from known horseshoe kidney not well visualized. Bladder: Appears normal for degree of bladder distention. Bilateral ureteral jets are visualized consistent with ureteral patency. Other: None. IMPRESSION: 1. Small extrarenal pelvis on the left. No hydronephrosis. Bilateral ureteral jets are visualized in the bladder confirming ureteral patency. 2. Known horseshoe kidney not well visualized by ultrasound. Electronically Signed   By: Carey BullocksWilliam  Veazey M.D.   On: 06/10/2021 08:25   US Renal  Result Date: 06/09/2021 CLINICAL DATA:  Left flank pain EXAM: RENAL / URINARY TRACT ULTRASOUND COMPLETE COMPARISON:  None. FINDINGS: Right Kidney: Renal measurements: 10.4 x 4.4 x 4.4 cm = volume: 106 mL.  Echogenicity within normal limits. No mass or hydronephrosis visualized. Left Kidney: Renal measurements: 14.0 x 5.4 x 5.8 cm = volume: 228 mL. Normal renal echotexture. Mild to moderate left-sided hydronephrosis and proximal left hydroureter. No evidence of nephrolithiasis. Bladder: The right ureteral jet is well visualized. The left ureteral jet is not seen. No filling defects within the bladder. Other: None. IMPRESSION: 1. Mild to moderate left-sided hydronephrosis and proximal left hydroureter, without clear cause for obstruction identified. No evidence of nephrolithiasis. 2. Otherwise unremarkable appearance of the kidneys. Electronically Signed   By: Sharlet SalinaMichael  Brown M.D.   On: 06/09/2021 21:18    Meds ordered this encounter  Medications   lactated ringers infusion   tamsulosin (FLOMAX)  capsule 0.4 mg   HYDROmorphone (DILAUDID) injection 1 mg   lactated ringers infusion   ondansetron (ZOFRAN) injection 4 mg   cefadroxil (DURICEF) 500 MG capsule    Sig: Take 1 capsule (500 mg total) by mouth 2 (two) times daily.    Dispense:  14 capsule    Refill:  0    Order Specific Question:   Supervising Provider    Answer:   Mariel Aloe A [[id number]]   oxyCODONE (ROXICODONE) 5 MG immediate release tablet    Sig: Take 1 tablet (5 mg total) by mouth every 6 (six) hours as needed for up to 3 days for breakthrough pain.    Dispense:  12 tablet    Refill:  0    Order Specific Question:   Supervising Provider    Answer:   Warden Fillers [[id number]]   A/P Reita Cliche, CNM) --26 y.o. [phone] at [redacted]w[redacted]d with di-di twin gestation --No acute concerns found on repeat renal imaging --Will initiate treatment for UTI --3 day course Roxicodone, existing rx Flexeril for muscle spasm --Urine culture in work --Meets criteria for Chronic HTN, no meds (01/24 and 02/02) --Language barrier: interpreter Viria present for all interaction with Reita Cliche, CNM --Discharge home in stable condition with strict return  precautions  Clayton Bibles, MSA, MSN, CNM Certified Nurse Midwife, [facility] for [employer], [facility] 06/10/21 11:08 AM

## 2021-06-12 LAB — CULTURE, OB URINE: Culture: >=100000 — AB

## 2021-06-14 ENCOUNTER — Ambulatory Visit (INDEPENDENT_AMBULATORY_CARE_PROVIDER_SITE_OTHER): Payer: Medicaid Other | Admitting: Family Medicine

## 2021-06-14 ENCOUNTER — Encounter: Payer: Self-pay | Admitting: Family Medicine

## 2021-06-14 ENCOUNTER — Other Ambulatory Visit: Payer: Self-pay | Admitting: Family Medicine

## 2021-06-14 ENCOUNTER — Other Ambulatory Visit: Payer: Self-pay

## 2021-06-14 VITALS — BP 136/93 | HR 103 | Wt 223.5 lb

## 2021-06-14 DIAGNOSIS — Q631 Lobulated, fused and horseshoe kidney: Secondary | ICD-10-CM

## 2021-06-14 DIAGNOSIS — O30049 Twin pregnancy, dichorionic/diamniotic, unspecified trimester: Secondary | ICD-10-CM | POA: Insufficient documentation

## 2021-06-14 DIAGNOSIS — Z789 Other specified health status: Secondary | ICD-10-CM

## 2021-06-14 DIAGNOSIS — I10 Essential (primary) hypertension: Secondary | ICD-10-CM | POA: Diagnosis not present

## 2021-06-14 DIAGNOSIS — O30042 Twin pregnancy, dichorionic/diamniotic, second trimester: Secondary | ICD-10-CM

## 2021-06-14 DIAGNOSIS — O0990 Supervision of high risk pregnancy, unspecified, unspecified trimester: Secondary | ICD-10-CM | POA: Diagnosis not present

## 2021-06-14 DIAGNOSIS — O2340 Unspecified infection of urinary tract in pregnancy, unspecified trimester: Secondary | ICD-10-CM | POA: Insufficient documentation

## 2021-06-14 DIAGNOSIS — O2342 Unspecified infection of urinary tract in pregnancy, second trimester: Secondary | ICD-10-CM

## 2021-06-14 DIAGNOSIS — Z23 Encounter for immunization: Secondary | ICD-10-CM | POA: Diagnosis not present

## 2021-06-14 DIAGNOSIS — Z124 Encounter for screening for malignant neoplasm of cervix: Secondary | ICD-10-CM

## 2021-06-14 MED ORDER — NIFEDIPINE ER OSMOTIC RELEASE 30 MG PO TB24: 30.0000 mg | ORAL_TABLET | Freq: Every day | ORAL | 3 refills | Status: DC

## 2021-06-14 MED ORDER — ASPIRIN EC 81 MG PO TBEC: 81.0000 mg | DELAYED_RELEASE_TABLET | Freq: Every day | ORAL | 11 refills | Status: DC

## 2021-06-14 MED ORDER — FAMOTIDINE 40 MG PO TABS: 40.0000 mg | ORAL_TABLET | Freq: Every day | ORAL | 5 refills | Status: DC

## 2021-06-14 NOTE — Progress Notes (Signed)
Subjective:   Virginia Pearson is a 26 y.o. [phone] at [redacted]w[redacted]d by early ultrasound being seen today for her first obstetrical visit.  Her obstetrical history is significant for  cHTN, congenital horseshoe kidney . Patient does intend to breast feed. Pregnancy history fully reviewed.  Patient reports heartburn and nausea.  HISTORY: OB History  Gravida Para Term Preterm AB Living  3 2 1 1  0 2  SAB IAB Ectopic Multiple Live Births  0 0 0 0 2    # Outcome Date GA Lbr Len/2nd Weight Sex Delivery Anes PTL Lv  3 Current           2 Term 02/26/19 [redacted]w[redacted]d 29:20 / 00:01 7 lb 13.8 oz (3.566 kg) M Vag-Spont None  LIV     Name: Horacek,BOY Mykael     Apgar1: 6  Apgar5: 9  1 Preterm 11/26/15 [redacted]w[redacted]d  6 lb 8 oz (2.948 kg)  Vag-Spont   LIV     Last pap smear: No results found for: DIAGPAP, HPV, HPVHIGH *needs*  Past Medical History:  Diagnosis Date   Chest pain    Headache    SOB (shortness of breath)    Tachycardia    Past Surgical History:  Procedure Laterality Date   NO PAST SURGERIES     History reviewed. No pertinent family history. Social History   Tobacco Use   Smoking status: Never   Smokeless tobacco: Never  Substance Use Topics   Alcohol use: No   Drug use: No   No Known Allergies Current Outpatient Medications on File Prior to Visit  Medication Sig Dispense Refill   acetaminophen (TYLENOL) 325 MG tablet Take 650 mg by mouth every 6 (six) hours as needed.     Blood Pressure Monitoring DEVI 1 each by Does not apply route once a week. 1 each 0   cefadroxil (DURICEF) 500 MG capsule Take 1 capsule (500 mg total) by mouth 2 (two) times daily. 14 capsule 0   cyclobenzaprine (FLEXERIL) 10 MG tablet Take 1 tablet (10 mg total) by mouth 3 (three) times daily as needed for muscle spasms. 20 tablet 0   Prenatal Vit-Fe Fumarate-FA (MULTIVITAMIN-PRENATAL) 27-0.8 MG TABS tablet Take 1 tablet by mouth daily at 12 noon.     promethazine (PHENERGAN) 25 MG tablet Take 1  tablet (25 mg total) by mouth every 6 (six) hours as needed for nausea or vomiting. 20 tablet 0   No current facility-administered medications on file prior to visit.     Exam   Vitals:   06/14/21 1331  BP: (!) 136/93  Pulse: (!) 103  Weight: 223 lb 8 oz (101.4 kg)   Fetal Heart Rate (bpm): 165/  Uterus:     Pelvic Exam: Perineum: no hemorrhoids, normal perineum   Vulva: normal external genitalia, no lesions   Vagina:  normal mucosa mild amount of white discharge but denies any symptoms   Cervix: no lesions and normal, pap smear done.   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 Supervision of high risk pregnancy, antepartum BP mild range, see below FHR normal Pap done at [facility] last year per patient, will do ROI Initial labs drawn. Continue prenatal vitamins. Genetic Screening discussed, NIPS: ordered. Ultrasound discussed; fetal anatomic survey: ordered. Problem list reviewed and updated. The nature of [facility] with multiple MDs and other Advanced Practice Providers was explained to patient; also emphasized  that residents, students are part of our team.  2. Chronic hypertension Start prenatal ASA Has baseline labs with exception of UPCR, will collect today Start Nifedipine 30 XL today given ongoing mild range BP's  3. Horseshoe kidney Missed Urology visit this morning Reviewed imaging findings and need for Urology consultation, will assist  4. Language barrier Spanish  5. Urinary tract infection in mother during second trimester of pregnancy E.coli infection from last Ucx from MAU visit on 06/10/2021, was given 7 day course of Cefadroxil Reports she is still finishing the course Check Ucx at next visit  6. Dichorionic diamniotic twin pregnancy Discussed high risk pregnancy Need for increased monitoring Will start ASA  Routine obstetric precautions reviewed. Return in 4 weeks (on 07/12/2021) for [facility], ob visit.

## 2021-06-14 NOTE — Patient Instructions (Signed)
Segundo trimestre de embarazo Second Trimester of Pregnancy  El segundo trimestre de embarazo va desde la semana 13 hasta la semana 27. Es decir desde el mes 4 hasta el mes 6 de embarazo. El segundo trimestre suele ser el momento en el que mejor se siente. Su organismo se ha adaptado a estarembarazada, y comienza a sentirse fsicamente mejor. Durante el segundo trimestre: Las nuseas del embarazo han disminuido o han desaparecido completamente. Usted puede tener ms energa. Es posible que tenga un aumento del apetito. El segundo trimestre es tambin un perodo en el que el beb en gestacin (feto) crece rpidamente. Hacia el final del sexto mes, el feto puede medir aproximadamente 12 pulgadas y pesar alrededor de 1 libras. Es probable que sienta que el beb se mueve (da pataditas) entre las 16 y 20semanas del embarazo. Cambios en el cuerpo durante el segundo trimestre Su cuerpo continua experimentando numerosos cambios durante su segundo trimestre. Los cambios varan y generalmente vuelven a la normalidad despusdel nacimiento del beb. Cambios fsicos Seguir aumentando de peso. Notar que la parte baja del abdomen sobresale. Podrn aparecer las primeras estras en las caderas, el abdomen y las mamas. Las mamas seguirn creciendo y se tornarn sensibles. Pueden aparecer zonas oscuras o manchas (cloasma o mscara del embarazo) en el rostro. Es posible que se forme una lnea oscura desde el ombligo hasta la zona del pubis (linea nigra). Tal vez haya cambios en el cabello. Esto cambios pueden incluir su engrosamiento, crecimiento rpido y cambios en la textura. A algunas personas tambin se les cae el cabello durante o despus del embarazo, o tienen el cabello seco o fino. Cambios en la salud Comienza a tener dolores de cabeza. Es posible que tenga acidez estomacal. Puede tener estreimiento. Pueden aparecer hemorroides o abultarse e hincharse las venas (venas varicosas). Las encas pueden  sangrar y estar sensibles al cepillado y al hilo dental. Tal vez tenga necesidad de orinar con ms frecuencia porque el feto est ejerciendo presin sobre la vejiga. Puede sentir dolor en la espalda. Esto se debe a: Aumento de peso. Las hormonas del embarazo relajan las articulaciones en la pelvis. Un cambio en el peso y los msculos que ayudan a mantener su equilibrio. Siga estas instrucciones en su casa: Medicamentos Siga las instrucciones del mdico en relacin con el uso de medicamentos. Durante el embarazo, hay medicamentos que pueden tomarse y otros que no. No tome medicamentos a menos que lo haya autorizado el mdico. Tome vitaminas prenatales que contengan por lo menos 600microgramos (mcg) de cido flico. Comida y bebida Lleve una dieta saludable que incluya frutas y verduras frescas, cereales integrales, buenas fuentes de protenas como carnes magras, huevos o tofu, y productos lcteos descremados. Evite la carne cruda y el jugo, la leche y el queso sin pasteurizar. Estos portan grmenes que pueden provocar dao tanto a usted como al beb. Es posible que tenga que tomar estas medidas para prevenir o tratar el estreimiento: Beber suficiente lquido como para mantener la orina de color amarillo plido. Consumir alimentos ricos en fibra, como frijoles, cereales integrales, y frutas y verduras frescas. Limitar el consumo de alimentos ricos en grasa y azcares procesados, como los alimentos fritos o dulces. Actividad Haga ejercicio solamente como se lo haya indicado el mdico. La mayora de las personas pueden continuar su rutina de ejercicios durante el embarazo. Intente realizar como mnimo 30minutos de actividad fsica por lo menos 5das a la semana. Deje de hacer ejercicio si comienza a tener contracciones en el tero.   Deje de hacer ejercicio si le aparecen dolor o clicos en la parte baja del vientre o de la espalda. Evite hacer ejercicio si hace mucho calor o humedad, o si se  encuentra a una altitud elevada. Evite levantar pesos excesivos. Si lo desea, puede seguir teniendo relaciones sexuales, salvo que el mdico le indique lo contrario. Alivio del dolor y del malestar Use un sujetador que le brinde buen soporte para prevenir las molestias causadas por la sensibilidad en las mamas. Dese baos de asiento con agua tibia para aliviar el dolor o las molestias causadas por las hemorroides. Use una crema para las hemorroides si el mdico la autoriza. Descanse con las piernas levantadas (elevadas) si tiene calambres en las piernas o dolor en la parte baja de la espalda. Si tiene venas varicosas: Use medias de compresin como se lo haya indicado el mdico. Eleve los pies durante 15minutos, 3 o 4veces por da. Limite el consumo de sal en su dieta. Seguridad Use el cinturn de seguridad en todo momento mientras conduce o va en auto. Hable con el mdico si es vctima de maltrato verbal o fsico. Estilo de vida No se d baos de inmersin en agua caliente, baos turcos ni saunas. No se haga duchas vaginales. No use tampones ni toallas higinicas perfumadas. Evite el contacto con las bandejas sanitarias de los gatos y la tierra que estos animales usan. Estos elementos contienen bacterias que pueden causar defectos congnitos al beb y la posible prdida del feto debido a un aborto espontneo o muerte fetal. No use remedios a base de hierbas, alcohol, drogas ilegales ni medicamentos que no estn aprobados por el mdico. Las sustancias qumicas de estos productos pueden daar al beb. No consuma ningn producto que contenga nicotina o tabaco, como cigarrillos, cigarrillos electrnicos y tabaco de mascar. Si necesita ayuda para dejar de fumar, consulte al mdico. Instrucciones generales Durante una visita prenatal de rutina, el mdico le har un examen fsico y otras pruebas. Tambin le hablar sobre su salud general. Cumpla con todas las visitas de seguimiento. Esto es  importante. Pdale al mdico que la derive a clases de educacin prenatal en su localidad. Pida ayuda si tiene necesidades nutricionales o de asesoramiento durante el embarazo. El mdico puede aconsejarla o derivarla a especialistas para que la ayuden con diferentes necesidades. Dnde buscar ms informacin American Pregnancy Association (Asociacin Americana del Embarazo): americanpregnancy.org American College of Obstetricians and Gynecologists (Colegio Estadounidense de Obstetras y Gineclogos): acog.org/womens-health/pregnancy? Office on Women's Health (Oficina para la Salud de la Mujer): womenshealth.gov/pregnancy Comunquese con un mdico si tiene: Un dolor de cabeza que no desaparece despus de tomar analgsicos. Cambios en la visin o ve manchas delante de los ojos. Clicos leves, presin en la pelvis o dolor persistente en el abdomen. Nuseas persistentes, vmitos o diarrea. Secrecin vaginal con mal olor u orina con olor ftido. Dolor al orinar. Hinchazn sbita o extrema del rostro, las manos, los tobillos, los pies o las piernas. Fiebre. Busque ayuda de inmediato si: Tiene una prdida de lquido por la vagina. Tiene sangrado ligero o manchas vaginales. Tiene dolor intenso o clicos en el abdomen. Presenta dificultad para respirar. Siente dolor en el pecho. Tiene episodios de desmayo. No ha sentido a su beb moverse durante el perodo de tiempo que le indic el mdico. Tiene dolor, hinchazn o enrojecimiento nuevos o ms intensos en un brazo o una pierna. Resumen El segundo trimestre de embarazo va desde la semana 13 hasta la 27 (desde el mes 4 hasta el 6).   No use remedios a base de hierbas, alcohol, drogas ilegales ni medicamentos que no estn aprobados por el mdico. Las sustancias qumicas de estos productos pueden daar al beb. Haga ejercicio solamente como se lo haya indicado el mdico. La mayora de las personas pueden continuar su rutina de ejercicios durante el  embarazo. Cumpla con todas las visitas de seguimiento. Esto es importante. Esta informacin no tiene como fin reemplazar el consejo del mdico. Asegresede hacerle al mdico cualquier pregunta que tenga. Document Revised: 11/03/2019 Document Reviewed: 11/03/2019 Elsevier Patient Education  2022 Elsevier Inc.  Eleccin del mtodo anticonceptivo Contraception Choices La anticoncepcin, o los mtodos anticonceptivos, hace referencia a los mtodoso dispositivos que evitan el embarazo. Mtodos hormonales  Implante anticonceptivo Un implante anticonceptivo consiste en un tubo delgado de plstico que contiene una hormona que evita el embarazo. Es diferente de un dispositivo intrauterino (DIU). Un mdico lo inserta en la parte superior del brazo. Los implantespueden ser eficaces durante un mximo de 3 aos. Inyecciones de progestina sola Las inyecciones de progestina sola contienen progestina, una forma sinttica dela hormona progesterona. Un mdico las administra cada 3 meses. Pldoras anticonceptivas Las pldoras anticonceptivas son pastillas que contienen hormonas que evitan el embarazo. Deben tomarse una vez al da, preferentemente a la misma hora cadada. Se necesita una receta para utilizar este mtodo anticonceptivo. Parche anticonceptivo El parche anticonceptivo contiene hormonas que evitan el embarazo. Se coloca en la piel, debe cambiarse una vez a la semana durante tres semanas y debe retirarse en la cuarta semana. Se necesita una receta para utilizar este mtodoanticonceptivo. Anillo vaginal Un anillo vaginal contiene hormonas que evitan el embarazo. Se coloca en la vagina durante tres semanas y se retira en la cuarta semana. Luego se repite el proceso con un anillo nuevo. Se necesita una receta para utilizar este mtodoanticonceptivo. Anticonceptivo de emergencia Los anticonceptivos de emergencia son mtodos para evitar un embarazo despus de tener sexo sin proteccin. Vienen en forma de  pldora y pueden tomarse hasta 5 das despus de tener sexo. Funcionan mejor cuando se toman lo ms pronto posible luego de tener sexo. La mayora de los anticonceptivos de emergencia estn disponibles sin receta mdica. Este mtodo no debe utilizarse como elnico mtodo anticonceptivo. Mtodos de barrera  Condn masculino Un condn masculino es una vaina delgada que se coloca sobre el pene durante el sexo. Los condones evitan que el esperma ingrese en el cuerpo de la mujer. Pueden utilizarse con un una sustancia que mata a los espermatozoides (espermicida) para aumentar la efectividad. Deben desecharse despus de un uso. Condn femenino Un condn femenino es una vaina blanda y holgada que se coloca en la vagina antes de tener sexo. El condn evita que el esperma ingrese en el cuerpo de lamujer. Deben desecharse despus de un uso. Diafragma Un diafragma es una barrera blanda con forma de cpula. Se inserta en la vagina antes del sexo, junto con un espermicida. El diafragma bloquea el ingreso de esperma en el tero, y el espermicida mata a los espermatozoides. El diafragma debe permanecer en la vagina durante 6 a 8 horas despus de tener sexo y deberetirarse en el plazo de las 24 horas. Un diafragma es recetado y colocado por un mdico. Debe reemplazarse cada 1 a 2 aos, despus de dar a luz, de aumentar ms de 15lb (6.8kg) y de una cirugaplvica. Capuchn cervical Un capuchn cervical es una copa redonda y blanda de ltex o plstico que se coloca en el cuello uterino. Se inserta en la vagina   antes del sexo, junto con un espermicida. Bloquea el ingreso del esperma en el tero. El capuchn debe permanecer en el lugar durante 6 a 8 horas despus de tener sexo y debe retirarse en el plazo de las 48 horas. Un capuchn cervical debe ser recetado ycolocado por un mdico. Debe reemplazarse cada 2aos. Esponja Una esponja es una pieza blanda y circular de espuma de poliuretano que contiene espermicida. La  esponja ayuda a bloquear el ingreso de esperma en el tero, y el espermicida mata a los espermatozoides. Para utilizarla, debe humedecerla e insertarla en la vagina. Debe insertarse antes de tener sexo, debe permanecer dentro al menos durante 6 horas despus de tener sexo y deberetirarse y desecharse en el plazo de las 30 horas. Espermicidas Los espermicidas son sustancias qumicas que matan o bloquean al esperma y no lo dejan ingresar al cuello uterino y al tero. Vienen en forma de crema, gel, supositorio, espuma o comprimido. Un espermicida debe insertarse en la vagina con un aplicador al menos 10 o 15 minutos antes de tener sexo para dar tiempo a que surta efecto. El proceso debe repetirse cada vez que tenga sexo. Losespermicidas no requieren receta mdica. Anticonceptivos intrauterinos Dispositivo intrauterino (DIU) Un DIU es un dispositivo en forma de T que se coloca en el tero. Existen dos tipos: DIU hormonal.Este tipo contiene progestina, una forma sinttica de la hormona progesterona. Este tipo puede permanecer colocado durante 3 a 5 aos. DIU de cobre.Este tipo est recubierto con un alambre de cobre. Puede permanecer colocado durante 10 aos. Mtodos anticonceptivos permanentes Ligadura de trompas en la mujer En este mtodo, se sellan, atan u obstruyen las trompas de Falopio durante unaciruga para evitar que el vulo descienda hacia el tero. Esterilizacin histeroscpica En este mtodo, se coloca un implante pequeo y flexible dentro de cada trompa de Falopio. Los implantes hacen que se forme un tejido cicatricial en las trompas de Falopio y que las obstruya para que el espermatozoide no pueda llegar al vulo. El procedimiento demora alrededor de 3 meses para que seaefectivo. Debe utilizarse otro mtodo anticonceptivo durante esos 3 meses. Esterilizacin masculina Este es un procedimiento que consiste en atar los conductos que transportan el esperma (vasectoma). Luego del procedimiento,  el hombre puede eyacular lquido (semen). Debe utilizarse otro mtodo anticonceptivo durante 3 meses despus delprocedimiento. Mtodos de planificacin natural Planificacin familiar natural En este mtodo, la pareja no tiene sexo durante los das en que la mujer podra quedar embarazada. Mtodo calendario En este mtodo, la mujer realiza un seguimiento de la duracin de cada ciclo menstrual, identifica los das en los que se puede producir un embarazo y no tiene sexo durante esos das. Mtodo de la ovulacin En este mtodo, la pareja evita tener sexo durante la ovulacin. Mtodo sintotrmico Este mtodo implica no tener sexo durante la ovulacin. Normalmente, la mujer comprueba la ovulacinal observar cambios en su temperatura y en la consistencia del moco cervical. Mtodo posovulacin En este mtodo, la pareja espera a que finalice la ovulacin para tener sexo. Dnde buscar ms informacin Centers for Disease Control and Prevention (Centros para el Control y la Prevencin de Enfermedades): www.cdc.gov Resumen La anticoncepcin, o los mtodos anticonceptivos, hace referencia a los mtodos o dispositivos que evitan el embarazo. Los mtodos anticonceptivos hormonales incluyen implantes, inyecciones, pastillas, parches, anillos vaginales y anticonceptivos de emergencia. Los mtodos anticonceptivos de barrera pueden incluir condones masculinos, condones femeninos, diafragmas, capuchones cervicales, esponjas y espermicidas. Existen dos tipos de DIU (dispositivo intrauterino). Un DIU puede colocarse en   el tero de una mujer para evitar el embarazo durante 3 a 5 aos. La esterilizacin permanente puede realizarse mediante un procedimiento tanto en los hombres como en las mujeres. Los mtodos de planificacin familiar natural implican no tener sexo durante los das en que la mujer podra quedar embarazada. Esta informacin no tiene como fin reemplazar el consejo del mdico. Asegresede hacerle al mdico  cualquier pregunta que tenga. Document Revised: 11/25/2019 Document Reviewed: 11/25/2019 Elsevier Patient Education  2022 Elsevier Inc.  

## 2021-06-15 ENCOUNTER — Encounter: Payer: Self-pay | Admitting: *Deleted

## 2021-06-15 LAB — CBC/D/PLT+RPR+RH+ABO+RUBIGG...
Antibody Screen: NEGATIVE
Basophils Absolute: 0 10*3/uL (ref 0.0–0.2)
Basos: 0 %
EOS (ABSOLUTE): 0 10*3/uL (ref 0.0–0.4)
Eos: 0 %
HCV Ab: <0.1 s/co ratio (ref 0.0–0.9)
HIV Screen 4th Generation wRfx: NONREACTIVE
Hematocrit: 32.8 % — ABNORMAL LOW (ref 34.0–46.6)
Hemoglobin: 10.8 g/dL — ABNORMAL LOW (ref 11.1–15.9)
Hepatitis B Surface Ag: NEGATIVE
Immature Grans (Abs): 0 10*3/uL (ref 0.0–0.1)
Immature Granulocytes: 1 %
Lymphocytes Absolute: 1.7 10*3/uL (ref 0.7–3.1)
Lymphs: 22 %
MCH: 25 pg — ABNORMAL LOW (ref 26.6–33.0)
MCHC: 32.9 g/dL (ref 31.5–35.7)
MCV: 76 fL — ABNORMAL LOW (ref 79–97)
Monocytes Absolute: 0.4 10*3/uL (ref 0.1–0.9)
Monocytes: 5 %
Neutrophils Absolute: 5.4 10*3/uL (ref 1.4–7.0)
Neutrophils: 72 %
Platelets: 420 10*3/uL (ref 150–450)
RBC: 4.32 x10E6/uL (ref 3.77–5.28)
RDW: 14.6 % (ref 11.7–15.4)
RPR Ser Ql: NONREACTIVE
Rh Factor: POSITIVE
Rubella Antibodies, IGG: 2.7 index (ref >0.99)
WBC: 7.6 10*3/uL (ref 3.4–10.8)

## 2021-06-15 LAB — PROTEIN / CREATININE RATIO, URINE
Creatinine, Urine: 62.8 mg/dL
Protein, Ur: 16.2 mg/dL
Protein/Creat Ratio: 258 mg/g creat — ABNORMAL HIGH (ref 0–200)

## 2021-06-15 LAB — HCV INTERPRETATION

## 2021-06-15 LAB — HEMOGLOBIN A1C
Est. average glucose Bld gHb Est-mCnc: 131 mg/dL
Hgb A1c MFr Bld: 6.2 % — ABNORMAL HIGH (ref 4.8–5.6)

## 2021-06-16 ENCOUNTER — Telehealth: Payer: Self-pay

## 2021-06-16 MED ORDER — GLUCOSE BLOOD VI STRP: ORAL_STRIP | 12 refills | Status: DC

## 2021-06-16 MED ORDER — ACCU-CHEK GUIDE W/DEVICE KIT: 1.0000 | PACK | Freq: Four times a day (QID) | 0 refills | Status: DC

## 2021-06-16 MED ORDER — ACCU-CHEK SOFTCLIX LANCETS MISC: 12 refills | Status: DC

## 2021-06-16 NOTE — Telephone Encounter (Signed)
Call placed to pt with Interpreter Eda. Pt did not answer. Left VM for pt to return call to office.  Appt made with [name] on 2/23 at 115pm. Next OB appt 07/12/21. Also, all GDM supplies sent to pharmacy on file. Laney Pastor

## 2021-06-16 NOTE — Telephone Encounter (Signed)
-----   Message from Venora Maples, MD sent at 06/16/2021 11:41 AM EST ----- Abnormal A1c, pre-diabetic range but very high risk of GDM in this pregnancy. Will have her start checking sugars and doing dietary changes.  Clinical staff please call patient (Spanish speaking) to notify her and place referral to DM educator and send testing supplies.  FYI to [place], consider obtaining CGM if possible as I worry patient may struggle with intense medical regimen

## 2021-06-20 NOTE — Telephone Encounter (Signed)
Called pt with interpreter Raquel; VM left stating patient has prescriptions to pick up at pharmacy to bring to appt on 06/30/21. Will send MyChart message.

## 2021-06-29 ENCOUNTER — Encounter: Payer: Self-pay | Admitting: *Deleted

## 2021-06-30 ENCOUNTER — Encounter: Payer: Medicaid Other | Attending: Family Medicine | Admitting: Registered"

## 2021-06-30 ENCOUNTER — Other Ambulatory Visit: Payer: Self-pay | Admitting: General Practice

## 2021-06-30 ENCOUNTER — Other Ambulatory Visit: Payer: Self-pay

## 2021-06-30 ENCOUNTER — Ambulatory Visit (INDEPENDENT_AMBULATORY_CARE_PROVIDER_SITE_OTHER): Payer: Medicaid Other | Admitting: Registered"

## 2021-06-30 DIAGNOSIS — Z3A00 Weeks of gestation of pregnancy not specified: Secondary | ICD-10-CM | POA: Diagnosis not present

## 2021-06-30 DIAGNOSIS — O24410 Gestational diabetes mellitus in pregnancy, diet controlled: Secondary | ICD-10-CM

## 2021-06-30 DIAGNOSIS — Z713 Dietary counseling and surveillance: Secondary | ICD-10-CM | POA: Diagnosis not present

## 2021-06-30 DIAGNOSIS — O24919 Unspecified diabetes mellitus in pregnancy, unspecified trimester: Secondary | ICD-10-CM

## 2021-06-30 MED ORDER — PRENATAL 27-0.8 MG PO TABS
1.0000 | ORAL_TABLET | Freq: Every day | ORAL | 8 refills | Status: DC
  Filled 2021-06-30: qty 30, 30d supply, fill #0

## 2021-06-30 NOTE — Progress Notes (Signed)
[employer] video interpreter Jamison Neighbor [phone]  Patient was seen for Gestational Diabetes self-management on 06/30/21  Start time 1318 and End time 1413   Estimated due date: 12/24/21; [redacted]w[redacted]d  Clinical: Medications: reviewed Medical History: reviewed Labs: OGTT n/a, A1c 6.2%   Dietary and Lifestyle History: Pt states she has been taking prenatal vitamins and asked where to get more. RN placed Rx for her to pick up at [facility].  Patient was confused why [employer] gives her juice if she is not supposed to drink it.  Based on patient's glucometer and pt seems to verify that she goes to bed after midnight and she gets up around 10-11 am.   FBS 101-187 mg/dL; checks post prandial a few times 120-175 mg/dL  Physical Activity: not assessed Stress: not assessed Sleep: not assessed  24 hr Recall:  First Meal: beans, eggs, milk, fruit OR yogurt OR if not hungry just milk Snack: berries Second meal: quesadilla, water or juice OR spaghetti Snack: Third meal: 2 c rice, 3/4 c beans, chicken, salad Snack: Beverages:   NUTRITION INTERVENTION  Nutrition education (E-1) on the following topics:   Initial Follow-up  []  []  Definition of Gestational Diabetes []  []  Why dietary management is important in controlling blood glucose [x]  []  Effects each nutrient has on blood glucose levels []  []  Simple carbohydrates vs complex carbohydrates []  []  Fluid intake [x]  []  Creating a balanced meal plan []  []  Carbohydrate counting  [x]  []  When to check blood glucose levels [x]  []  Proper blood glucose monitoring techniques [x]  []  Effect of stress and stress reduction techniques  [x]  []  Exercise effect on blood glucose levels, appropriate exercise during pregnancy []  []  Importance of limiting caffeine and abstaining from alcohol and smoking []  []  Medications used for blood sugar control during pregnancy []  []  Hypoglycemia and rule of 15 []  []  Postpartum self care  Patient instructed to monitor glucose  levels: FBS: 60 - ? 95 mg/dL (some clinics use 90 for cutoff) 1 hour: ? 140 mg/dL 2 hour: ? mg/dL  Patient received handouts: Nutrition Diabetes and Pregnancy (Spanish) Carbohydrate Counting List  Patient will be seen for follow-up as needed.

## 2021-07-05 ENCOUNTER — Other Ambulatory Visit: Payer: Self-pay

## 2021-07-07 ENCOUNTER — Telehealth: Payer: Self-pay | Admitting: Lactation Services

## 2021-07-07 DIAGNOSIS — D573 Sickle-cell trait: Secondary | ICD-10-CM | POA: Insufficient documentation

## 2021-07-07 NOTE — Telephone Encounter (Signed)
Called patient with assistance of [employer], Administrator, sports. Patient did not answer. LM for her to call the office at [phone] for results. Will attempt to call patient at a later time.  ?

## 2021-07-07 NOTE — Telephone Encounter (Signed)
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  Pt reports that she knew from her first pregnancy and that her partner has already been tested as well (he is negative) and is the current FOB for this pregnancy.  Pt provided with the [place] genetic counseling number for more information.  Pt verbalized understanding with further questions.   ? ?Carless Slatten,RN  ?07/07/21 ?

## 2021-07-12 ENCOUNTER — Encounter: Payer: Self-pay | Admitting: Family Medicine

## 2021-07-12 ENCOUNTER — Ambulatory Visit (INDEPENDENT_AMBULATORY_CARE_PROVIDER_SITE_OTHER): Payer: Medicaid Other | Admitting: Family Medicine

## 2021-07-12 ENCOUNTER — Other Ambulatory Visit: Payer: Self-pay

## 2021-07-12 VITALS — BP 123/84 | HR 102 | Wt 227.7 lb

## 2021-07-12 DIAGNOSIS — O0990 Supervision of high risk pregnancy, unspecified, unspecified trimester: Secondary | ICD-10-CM

## 2021-07-12 DIAGNOSIS — Z789 Other specified health status: Secondary | ICD-10-CM

## 2021-07-12 DIAGNOSIS — Q631 Lobulated, fused and horseshoe kidney: Secondary | ICD-10-CM

## 2021-07-12 DIAGNOSIS — O24919 Unspecified diabetes mellitus in pregnancy, unspecified trimester: Secondary | ICD-10-CM

## 2021-07-12 DIAGNOSIS — O30042 Twin pregnancy, dichorionic/diamniotic, second trimester: Secondary | ICD-10-CM

## 2021-07-12 DIAGNOSIS — D573 Sickle-cell trait: Secondary | ICD-10-CM

## 2021-07-12 DIAGNOSIS — I10 Essential (primary) hypertension: Secondary | ICD-10-CM

## 2021-07-12 MED ORDER — METFORMIN HCL 500 MG PO TABS: 500.0000 mg | ORAL_TABLET | Freq: Two times a day (BID) | ORAL | 5 refills | Status: DC

## 2021-07-12 MED ORDER — CYCLOBENZAPRINE HCL 10 MG PO TABS: 10.0000 mg | ORAL_TABLET | Freq: Three times a day (TID) | ORAL | 0 refills | Status: DC | PRN

## 2021-07-12 NOTE — Patient Instructions (Signed)
Segundo trimestre de [employer] Second Trimester of Pregnancy El segundo trimestre de Media planner va desde la semana 13 hasta la semana 27. Es Software engineer desde el mes 4 hasta el mes 8 de [place]. El segundo trimestre suele ser el momento en el que mejor se siente. Su organismo se ha adaptado a Public relations account executive, y comienza a Print production planner. Durante el segundo trimestre: Las nuseas del embarazo han disminuido o han desaparecido completamente. Usted puede tener ms energa. Es posible que tenga un aumento del apetito. El segundo trimestre es tambin un perodo en el que el beb en gestacin (feto) crece rpidamente. Hacia el final del sexto mes, el feto puede medir aproximadamente 12 pulgadas y pesar alrededor de 1 libras. Es probable que sienta que el beb se Software engineer (da pataditas) entre las 70 y [place]. Cambios en el cuerpo durante el segundo trimestre Su cuerpo continua experimentando numerosos cambios durante su segundo trimestre. Los cambios varan y generalmente vuelven a la normalidad despus del nacimiento del beb. Cambios fsicos Seguir [employer]. Notar que la parte baja del abdomen sobresale. Podrn aparecer las primeras Apache Corporation caderas, el abdomen y las [place]. Las Lincoln National Corporation seguirn creciendo y se tornarn sensibles. Pueden aparecer zonas oscuras o manchas (cloasma o mscara del embarazo) en el rostro. Es posible que se forme una lnea oscura desde el ombligo hasta la zona del pubis (linea nigra). Tal vez haya cambios en el cabello. Esto cambios pueden incluir su engrosamiento, crecimiento rpido y Harley[employer]son textura. A algunas personas tambin se les cae el cabello durante o despus del [place], o tienen el cabello seco o fino. Cambios en la salud Comienza a tener dolores de [place]. Es posible que tenga acidez estomacal. Puede tener estreimiento. Pueden aparecer hemorroides o abultarse e hincharse las venas (venas varicosas). Las Control and instrumentation engineer y estar sensibles al cepillado y al hilo dental. Letitia Caul vez tenga necesidad de orinar con ms frecuencia porque el feto est ejerciendo presin sobre la vejiga. Puede sentir dolor en la espalda. Esto se debe a: Aumento de peso. Las hormonas del [place] articulaciones en la pelvis. Un cambio en el peso y los msculos que ayudan a Theatre manager su equilibrio. Siga estas instrucciones en su casa: [place] instrucciones del mdico en relacin con el uso de medicamentos. Durante el embarazo, hay medicamentos que pueden tomarse y 51 que no. No tome medicamentos a menos que lo haya autorizado el mdico. Tome vitaminas prenatales que contengan por lo menos [id number] (mcg) de cido flico. Comida y bebida Lleve una dieta saludable que incluya frutas y verduras frescas, cereales integrales, buenas fuentes de protenas como carnes [place], huevos o tofu, y productos lcteos descremados. Evite la carne cruda y el [place], la [place] y el queso sin Radio producer. Estos portan grmenes que pueden provocar dao tanto a usted como al beb. Es posible que tenga que tomar estas medidas para prevenir o tratar el estreimiento: Electronics engineer suficiente lquido como para Theatre manager la orina de color amarillo plido. Consumir alimentos ricos en fibra, como frijoles, cereales integrales, y frutas y verduras frescas. Limitar el consumo de alimentos ricos en grasa y azcares procesados, como los alimentos fritos o dulces. Actividad Haga ejercicio solamente como se lo haya indicado el mdico. La mayora de las personas pueden continuar su rutina de ejercicios durante el [place]. Intente realizar como mnimo 28minutos de actividad fsica por lo menos 5das a la semana. Deje de hacer ejercicio si comienza a Cytogeneticist  Esaw Dace de hacer ejercicio si le aparecen dolor o clicos en la parte baja del vientre o de la espalda. Evite hacer ejercicio si hace mucho calor o humedad, o si se  encuentra a una altitud elevada. Evite levantar pesos EMCOR. Si lo desea, puede seguir teniendo [employer], salvo que el mdico le indique lo contrario. Alivio del dolor y del Tree surgeon Use un sujetador que le brinde buen soporte para prevenir las molestias causadas por la sensibilidad en las [place]. Dese baos de asiento con agua tibia para Best boy o las molestias causadas por las hemorroides. Use una crema para las hemorroides si el mdico la autoriza. Descanse con las piernas levantadas (elevadas) si tiene calambres en las piernas o dolor en la parte baja de la espalda. Si tiene venas varicosas: Use medias de compresin como se lo haya indicado el mdico. Eleve los pies durante 19minutos, 3 o 4veces por Training and development officer. Limite el consumo de sal en su dieta. Seguridad Use el cinturn de seguridad en todo momento mientras conduce o va en auto. Hable con el mdico si es vctima de Terex Corporation o fsico. Estilo de vida No se d baos de inmersin en agua caliente, baos turcos ni saunas. No se haga duchas vaginales. No use tampones ni toallas higinicas perfumadas. Evite el contacto con las bandejas sanitarias de los gatos y la tierra que estos animales usan. Estos elementos contienen bacterias que pueden causar defectos congnitos al beb y la posible prdida del feto debido a un aborto espontneo o muerte fetal. No use remedios a base de hierbas, alcohol, drogas ilegales ni medicamentos que no estn aprobados por el mdico. Las sustancias qumicas de estos productos pueden daar al beb. No consuma ningn producto que contenga nicotina o tabaco, como cigarrillos, cigarrillos electrnicos y tabaco de Higher education careers adviser. Si necesita ayuda para dejar de fumar, consulte al mdico. Instrucciones generales Durante una visita prenatal de rutina, el Viacom har un examen fsico y Equities trader. Tambin le hablar sobre su salud general. Cumpla con todas las visitas de seguimiento. Esto es  importante. Pdale al mdico que la derive a clases de educacin prenatal en su localidad. Pida ayuda si tiene necesidades nutricionales o de asesoramiento Solicitor. El mdico puede aconsejarla o derivarla a especialistas para que la ayuden con diferentes necesidades. Dnde buscar ms informacin American Pregnancy Association (Asociacin Americana del Embarazo): americanpregnancy.org SPX Corporation of Obstetricians and Gynecologists (Colegio Estadounidense de Obstetras y [place]): [email or web address]? Office on Home Depot ([place]): [email or web address] Comunquese con un mdico si tiene: Un dolor de cabeza que no desaparece despus de Teacher, adult education. Cambios en la visin o ve manchas delante de los ojos. Clicos leves, presin en la pelvis o dolor persistente en el abdomen. Nuseas persistentes, vmitos o diarrea. Secrecin vaginal con mal olor u orina con USAA ftido. Dolor al [place]. Hinchazn sbita o extrema del rostro, las manos, los tobillos, los pies o las piernas. Cristy Hilts. Busque ayuda de inmediato si: Tiene una prdida de lquido por la vagina. Tiene sangrado ligero o manchas vaginales. Tiene dolor intenso o clicos en el abdomen. Presenta dificultad para respirar. Siente dolor en el pecho. Tiene episodios de Kimberly[employer]ark. No ha sentido a su beb moverse durante el perodo de Agilent Technologies indic el mdico. Tiene dolor, hinchazn o enrojecimiento nuevos o ms intensos en un brazo o una pierna. Resumen El segundo trimestre de embarazo va desde la semana 47 hasta la 27 (desde el mes 4 [place]  6). No use remedios a base de hierbas, alcohol, drogas ilegales ni medicamentos que no estn aprobados por el mdico. Las sustancias qumicas de estos productos pueden daar al beb. Haga ejercicio solamente como se lo haya indicado el mdico. La mayora de las personas pueden continuar su rutina de ejercicios durante el  [place]. Cumpla con todas las visitas de seguimiento. Esto es importante. Esta informacin no tiene Marine scientist el consejo del mdico. Asegrese de hacerle al mdico cualquier pregunta que tenga. Document Revised: 11/03/2019 Document Reviewed: 11/03/2019 Elsevier Patient Education  2022 [place] anticonceptivo Contraception Choices La anticoncepcin, o los mtodos anticonceptivos, hace referencia a los mtodos o dispositivos que evitan el [place]. Mtodos hormonales Implante anticonceptivo Un implante anticonceptivo consiste en un tubo delgado de plstico que contiene una hormona que evita el [place]. Es diferente de un dispositivo intrauterino (DIU). Un mdico lo inserta en la parte superior del brazo. Los implantes pueden ser eficaces durante un mximo de 3 aos. Inyecciones de progestina sola Las inyecciones de progestina sola contienen progestina, una forma sinttica de la hormona progesterona. Un mdico las administra cada 3 meses. Pldoras anticonceptivas Las pldoras anticonceptivas son pastillas que contienen hormonas que evitan el [place]. Deben tomarse una vez al da, preferentemente a [place]. Se necesita una receta para utilizar este mtodo anticonceptivo. Parche anticonceptivo El parche anticonceptivo contiene hormonas que evitan el [place]. Se coloca en la piel, debe cambiarse una vez a la semana durante tres semanas y debe retirarse en la cuarta semana. Se necesita una receta para utilizar este mtodo anticonceptivo. Anillo vaginal Un anillo vaginal contiene hormonas que evitan el embarazo. Se coloca en la vagina durante tres semanas y se retira en la cuarta semana. Luego se repite el proceso con un anillo nuevo. Se necesita una receta para utilizar este mtodo anticonceptivo. Anticonceptivo de emergencia Los anticonceptivos de emergencia son mtodos para evitar un embarazo despus de Best boy sexo sin proteccin. Vienen en forma  de pldora y pueden tomarse hasta 5 das despus de [place]. Funcionan mejor cuando se toman lo ms pronto posible luego de Merrill Lynch. La mayora de los anticonceptivos de emergencia estn disponibles sin receta mdica. Este mtodo no debe utilizarse como el nico mtodo anticonceptivo. Mtodos de barrera Condn masculino Un condn masculino es una vaina delgada que se coloca sobre el pene durante el sexo. Los condones evitan que el esperma ingrese en el cuerpo de la [place]. Pueden utilizarse con un una sustancia que mata a los espermatozoides (espermicida) para aumentar la efectividad. Deben desecharse despus de un uso. Condn femenino Un condn femenino es una vaina blanda y holgada que se coloca en la vagina antes de [place]. El condn evita que el esperma ingrese en el cuerpo de la [place]. Deben desecharse despus de un uso. Diafragma Un diafragma es una barrera blanda con forma de cpula. Se inserta en la vagina antes del sexo, junto con un espermicida. El diafragma bloquea el ingreso de esperma en el tero, y el espermicida mata a los espermatozoides. El Designer, television/film set en la vagina durante 6 a 8 horas despus de Best boy sexo y debe retirarse en el plazo de las 24 horas. Un diafragma es recetado y colocado por un mdico. Debe reemplazarse cada 1 a 2 aos, despus de dar a luz, de aumentar ms de 15lb (6.8kg) y de [place] plvica. Capuchn cervical Un capuchn cervical es una copa redonda y blanda de ltex o plstico que se  coloca en el cuello uterino. Se inserta en la vagina antes del sexo, junto con un espermicida. Bloquea el ingreso del esperma en el tero. El capuchn Medical illustrator durante 6 a 8 horas despus de Best boy sexo y debe retirarse en el plazo de las 48 horas. Un capuchn cervical debe ser recetado y colocado por un mdico. Debe reemplazarse cada 2aos. Esponja Una esponja es una pieza blanda y circular de espuma de poliuretano que contiene espermicida.  La esponja ayuda a bloquear el ingreso de esperma en el tero, y el espermicida mata a los espermatozoides. Marylyn Ishihara, debe humedecerla e insertarla en la vagina. Debe insertarse antes de Merrill Lynch, debe permanecer dentro al menos durante 6 horas despus de tener sexo y debe retirarse y Aeronautical engineer en el plazo de las 30 horas. Espermicidas Los espermicidas son sustancias qumicas que matan o bloquean al esperma y no lo dejan ingresar al cuello uterino y al tero. Vienen en forma de crema, gel, supositorio, espuma o comprimido. Un espermicida debe insertarse en la vagina con un aplicador al menos 10 o 15 minutos antes de tener sexo para dar tiempo a que surta [place]. El proceso debe repetirse cada vez que tenga sexo. Los espermicidas no requieren Furniture conservator/restorer. Anticonceptivos intrauterinos Dispositivo intrauterino (DIU) Un DIU es un dispositivo en forma de T que se coloca en el tero. Existen dos tipos: DIU hormonal.Este tipo contiene progestina, una forma sinttica de la hormona progesterona. Este tipo puede permanecer colocado durante 3 a 5 aos. DIU de cobre.Este tipo est recubierto con un alambre de cobre. Puede permanecer colocado durante 10 aos. Mtodos anticonceptivos permanentes Ligadura de trompas en la mujer En este mtodo, se sellan, atan u obstruyen las trompas de Falopio durante una ciruga para Product/process development scientist que el vulo descienda [place]. Esterilizacin histeroscpica En este mtodo, se coloca un implante pequeo y flexible dentro de cada trompa de Falopio. Los implantes hacen que se forme un tejido cicatricial en las trompas de Falopio y que las obstruya para que el espermatozoide no pueda llegar al vulo. El procedimiento demora alrededor de 3 meses para que sea [place]. Debe utilizarse otro mtodo anticonceptivo durante esos 3 meses. Esterilizacin masculina Este es un procedimiento que consiste en atar los conductos que transportan el esperma (vasectoma). Luego del  procedimiento, el hombre Equities trader lquido (semen). Debe utilizarse otro mtodo anticonceptivo durante 3 meses despus del procedimiento. Mtodos de planificacin natural Planificacin familiar natural En este mtodo, la pareja no tiene SPX Corporation la mujer podra quedar [place]. Mtodo calendario En este mtodo, la mujer realiza un seguimiento de la duracin de cada ciclo menstrual, identifica los [employer]vaco que se puede producir un Media planner y no tiene sexo durante esos das. Mtodo de la ovulacin En este mtodo, la pareja evita tener sexo durante la ovulacin. Mtodo sintotrmico Este mtodo implica no tener sexo durante la ovulacin. Normalmente, la mujer comprueba la ovulacin al observar cambios en su temperatura y en la consistencia del moco cervical. Mtodo posovulacin En este mtodo, la pareja espera a que finalice la ovulacin para Adult nurse. Dnde buscar ms informacin Centers for Disease Control and Prevention (Centros para el Control y Publishing copy de Arboriculturist): http://www.wolf.info/ Resumen La anticoncepcin, o los mtodos anticonceptivos, hace referencia a los mtodos o dispositivos que evitan el [place]. Los mtodos anticonceptivos hormonales incluyen implantes, inyecciones, pastillas, parches, anillos vaginales y anticonceptivos de Freight forwarder. Los mtodos anticonceptivos de barrera pueden incluir condones masculinos, condones femeninos, [place],  capuchones cervicales, esponjas y espermicidas. Sears Holdings Corporation tipos de DIU (dispositivo intrauterino). Un DIU puede colocarse en el tero de una mujer para evitar el embarazo durante 3 a 5 aos. La esterilizacin permanente puede realizarse mediante un procedimiento tanto en los hombres como en las mujeres. Los NIKE de Marine scientist natural implican no tener SPX Corporation la mujer podra quedar [place]. Esta informacin no tiene Marine scientist el consejo del mdico. Asegrese de  hacerle al mdico cualquier pregunta que tenga. Document Revised: 11/25/2019 Document Reviewed: 11/25/2019 Elsevier Patient Education  [place].

## 2021-07-12 NOTE — Progress Notes (Signed)
Would like Rx Flexeril refill today.  ?Need to find FHR with bedside US. ?

## 2021-07-12 NOTE — Progress Notes (Signed)
? ?  Subjective:  ?Virginia Pearson is a 26 y.o. [phone] at [redacted]w[redacted]d being seen today for ongoing prenatal care.  She is currently monitored for the following issues for this high-risk pregnancy and has Normal labor; Chest pain of uncertain etiology; Pyelonephritis complicating pregnancy; Kidney dysfunction; Supervision of high risk pregnancy, antepartum; Language barrier; Horseshoe kidney; Chronic hypertension; UTI (urinary tract infection) during pregnancy; Dichorionic diamniotic twin pregnancy; Diabetes mellitus during pregnancy, antepartum; and Sickle cell trait ([place]) on their problem list. ? ?Patient reports no complaints.  Contractions: Not present. Vag. Bleeding: None.  Movement: Present. Denies leaking of fluid.  ? ?The following portions of the patient's history were reviewed and updated as appropriate: allergies, current medications, past family history, past medical history, past social history, past surgical history and problem list. Problem list updated. ? ?Objective:  ? ?Vitals:  ? 07/12/21 1523  ?BP: 123/84  ?Pulse: (!) 102  ?Weight: 227 lb 11.2 oz (103.3 kg)  ? ? ?Fetal Status:     Movement: Present    ? ?General:  Alert, oriented and cooperative. Patient is in no acute distress.  ?Skin: Skin is warm and dry. No rash noted.   ?Cardiovascular: Normal heart rate noted  ?Respiratory: Normal respiratory effort, no problems with respiration noted  ?Abdomen: Soft, gravid, appropriate for gestational age. Pain/Pressure: Absent     ?Pelvic: Vag. Bleeding: None     ?Cervical exam deferred        ?Extremities: Normal range of motion.     ?Mental Status: Normal mood and affect. Normal behavior. Normal judgment and thought content.  ? ?Urinalysis:     ? ?Assessment and Plan:  ?Pregnancy: [id number] at [redacted]w[redacted]d ? ?1. Supervision of high risk pregnancy, antepartum ?BP and FHR normal ?AFP normal ?- AFP, Serum, Open Spina Bifida ? ?2. Chronic hypertension ?Normotensive on Nifedipine 30 XL, on ASA ?UPCR normal ? ?3.  Diabetes mellitus during pregnancy, antepartum, unspecified diabetes mellitus type ?Did not have paper log but reviewed her meter ?Fastings frequently in low 100's, frequent excursions into 120-140's for post prandials ?Recommended she start metformin 500mg  BID, rx sent to pharmacy and paper log provided ?Given she is already having elevated sugars at this early age and had abnormal A1c as well as twin gestation, may not be unreasonable to obtain fetal echo for twins ?Will message MFM to see what they think, anatomy scan not scheduled until 08/01/2021 ? ?4. Horseshoe kidney ?Referred to Urology ? ?5. Language barrier ?Spanish ? ?6. Dichorionic diamniotic twin pregnancy in second trimester ?Anatomy scan scheduled ?Di/di on Panorama as well ? ?7. Sickle cell trait ([place]) ?FOB tested negative last pregnancy ? ?Preterm labor symptoms and general obstetric precautions including but not limited to vaginal bleeding, contractions, leaking of fluid and fetal movement were reviewed in detail with the patient. ?Please refer to After Visit Summary for other counseling recommendations.  ?Return in 4 weeks (on 08/09/2021) for [facility], ob visit, needs MD. ? ? ?Clarnce Flock, MD ? ?

## 2021-07-14 LAB — AFP, SERUM, OPEN SPINA BIFIDA
AFP MoM: 2.49
AFP Value: 69.4 ng/mL
Gest. Age on Collection Date: 16.3 wk
Maternal Age At EDD: 23.9 a
OSBR Risk 1 IN: 726
Test Results:: NEGATIVE
Weight: 228 [lb_av]

## 2021-08-01 ENCOUNTER — Ambulatory Visit (HOSPITAL_BASED_OUTPATIENT_CLINIC_OR_DEPARTMENT_OTHER): Payer: Medicaid Other | Admitting: Obstetrics

## 2021-08-01 ENCOUNTER — Ambulatory Visit: Payer: Medicaid Other | Admitting: *Deleted

## 2021-08-01 ENCOUNTER — Encounter: Payer: Self-pay | Admitting: *Deleted

## 2021-08-01 ENCOUNTER — Other Ambulatory Visit: Payer: Self-pay | Admitting: *Deleted

## 2021-08-01 ENCOUNTER — Other Ambulatory Visit: Payer: Self-pay

## 2021-08-01 ENCOUNTER — Ambulatory Visit: Payer: Medicaid Other | Attending: Family Medicine

## 2021-08-01 VITALS — BP 120/73 | HR 101

## 2021-08-01 DIAGNOSIS — O10912 Unspecified pre-existing hypertension complicating pregnancy, second trimester: Secondary | ICD-10-CM

## 2021-08-01 DIAGNOSIS — Z3A19 19 weeks gestation of pregnancy: Secondary | ICD-10-CM

## 2021-08-01 DIAGNOSIS — O30042 Twin pregnancy, dichorionic/diamniotic, second trimester: Secondary | ICD-10-CM

## 2021-08-01 DIAGNOSIS — O24112 Pre-existing diabetes mellitus, type 2, in pregnancy, second trimester: Secondary | ICD-10-CM

## 2021-08-01 DIAGNOSIS — D573 Sickle-cell trait: Secondary | ICD-10-CM | POA: Diagnosis present

## 2021-08-01 DIAGNOSIS — Z789 Other specified health status: Secondary | ICD-10-CM

## 2021-08-01 DIAGNOSIS — N289 Disorder of kidney and ureter, unspecified: Secondary | ICD-10-CM

## 2021-08-01 DIAGNOSIS — O30041 Twin pregnancy, dichorionic/diamniotic, first trimester: Secondary | ICD-10-CM | POA: Diagnosis not present

## 2021-08-01 DIAGNOSIS — O0990 Supervision of high risk pregnancy, unspecified, unspecified trimester: Secondary | ICD-10-CM

## 2021-08-01 DIAGNOSIS — Z6838 Body mass index (BMI) 38.0-38.9, adult: Secondary | ICD-10-CM

## 2021-08-01 DIAGNOSIS — O10919 Unspecified pre-existing hypertension complicating pregnancy, unspecified trimester: Secondary | ICD-10-CM

## 2021-08-01 DIAGNOSIS — O2302 Infections of kidney in pregnancy, second trimester: Secondary | ICD-10-CM | POA: Insufficient documentation

## 2021-08-01 DIAGNOSIS — O99212 Obesity complicating pregnancy, second trimester: Secondary | ICD-10-CM | POA: Insufficient documentation

## 2021-08-01 NOTE — Progress Notes (Signed)
MFM Note ? ?Virginia Pearson was seen due to a spontaneously conceived twin pregnancy.  Her pregnancy has also been complicated by maternal obesity, chronic hypertension treated with Procardia, and an elevated hemoglobin A1c of 6.2%.  She is taking metformin once a day for management of polycystic ovarian syndrome (PCOS). ? ?The patient had a cell free DNA test earlier in her pregnancy which indicated a low risk for trisomy 26, 18, and 13.  These are predicted to be dizygotic/fraternal twins.  Two female fetuses are predicted.     ? ?A thick dividing membrane was noted separating the two fetuses, indicating that these are dichorionic, diamniotic twins. ? ?The fetal growth and amniotic fluid level appeared appropriate for both twin A and twin B.   ? ?There were no obvious anomalies noted in either twin A or twin B today.  However, the views of the fetal anatomy were limited today due to the fetal positions. ? ?The limitations of ultrasound in the detection of all anomalies was discussed today. ? ?The following were discussed during our consultation today: ? ?Possible pregestational diabetes and pregnancy ? ?The implications and management of diabetes in pregnancy was discussed in detail with the patient.  She was advised that her increased hemoglobin A1c of 6.2% increases her risk of having diabetes. ? ?She was advised to continue to monitor her fingersticks 4 times daily (fasting and 2 hours after each meal).   ? ?She was advised that our goals for her fingerstick values are fasting values of 90-95 or less and two-hour postprandial values of 120 or less.   ? ?She was advised that should her fingerstick values remain within normal limits, she should continue taking the metformin that she is taking for treatment of PCOS. ? ?The patient was advised that getting her fingerstick values as close to these goals as possible would provide her with the most optimal obstetrical outcome. ? ?The increased risk of  polyhydramnios, fetal macrosomia, and preeclampsia associated with diabetes was also discussed.   ? ?Chronic hypertension in pregnancy ? ?The implications and management of chronic hypertension in pregnancy was discussed.  ? ?The patient was advised to continue taking Procardia for treatment of hypertension during pregnancy.   ? ?She was advised that should her blood pressures be elevated later in pregnancy, the dosage of Procardia may have to be increased.   ? ?The increased risk of superimposed preeclampsia, an indicated preterm delivery, and possible fetal growth restriction due to chronic hypertension in pregnancy was discussed.  ? ?To decrease her risk of superimposed preeclampsia, she should continue taking a daily baby aspirin (81 mg daily) for preeclampsia prophylaxis.  She may take up to 2 tablets of baby aspirin per day due to the twin gestation. ? ?Due to chronic hypertension in pregnancy and the twin gestation, we will continue to follow her with monthly growth ultrasounds.   ? ?Weekly fetal testing should be started at 32 weeks. ? ?Dichorionic, diamniotic twin gestation ? ?The management of dichorionic twins was discussed.  She was advised that management of twin pregnancies will involve frequent ultrasound exams to assess the fetal growth and amniotic fluid level.  ? ?Delivery for uncomplicated dichorionic twins should occur at around 38 weeks.  She understands that an earlier delivery may be necessary should her blood pressures be elevated or should her glycemic control be poor. ? ?A follow-up exam was scheduled in 4 weeks to assess the fetal growth and to complete the views of the fetal anatomy.  ? ?  The patient stated that all of her questions have been answered.   ? ?All conversations were held with the patient today with the help of a Spanish interpreter. ? ?A total of 45 minutes was spent counseling and coordinating the care for this patient.  Greater than 50% of the time was spent in direct  face-to-face contact.  ?

## 2021-08-10 ENCOUNTER — Encounter: Payer: Self-pay | Admitting: Obstetrics & Gynecology

## 2021-08-10 ENCOUNTER — Ambulatory Visit (INDEPENDENT_AMBULATORY_CARE_PROVIDER_SITE_OTHER): Payer: Medicaid Other | Admitting: Obstetrics & Gynecology

## 2021-08-10 VITALS — BP 124/70 | HR 94 | Wt 230.0 lb

## 2021-08-10 DIAGNOSIS — O30042 Twin pregnancy, dichorionic/diamniotic, second trimester: Secondary | ICD-10-CM

## 2021-08-10 DIAGNOSIS — Z3A20 20 weeks gestation of pregnancy: Secondary | ICD-10-CM

## 2021-08-10 DIAGNOSIS — O0990 Supervision of high risk pregnancy, unspecified, unspecified trimester: Secondary | ICD-10-CM

## 2021-08-10 DIAGNOSIS — O10919 Unspecified pre-existing hypertension complicating pregnancy, unspecified trimester: Secondary | ICD-10-CM

## 2021-08-10 DIAGNOSIS — O24119 Pre-existing diabetes mellitus, type 2, in pregnancy, unspecified trimester: Secondary | ICD-10-CM

## 2021-08-10 DIAGNOSIS — O99210 Obesity complicating pregnancy, unspecified trimester: Secondary | ICD-10-CM

## 2021-08-10 MED ORDER — BLOOD PRESSURE KIT DEVI: 1.0000 | 0 refills | Status: DC

## 2021-08-10 MED ORDER — BLOOD PRESSURE MONITORING DEVI: 1.0000 | 0 refills | Status: DC

## 2021-08-10 MED ORDER — METFORMIN HCL 500 MG PO TABS: ORAL_TABLET | ORAL | 5 refills | Status: DC

## 2021-08-10 NOTE — Progress Notes (Signed)
? ?PRENATAL VISIT NOTE ? ?Subjective:  ?Virginia Pearson is a 26 y.o. [phone] at [redacted]w[redacted]d being seen today for ongoing prenatal care. Patient is Spanish-speaking only, interpreter present for this encounter.  She is currently monitored for the following issues for this high-risk pregnancy and has Pyelonephritis complicating pregnancy; Kidney dysfunction; Supervision of high risk pregnancy, antepartum; Language barrier; Horseshoe kidney; Chronic hypertension complicating pregnancy, antepartum; UTI (urinary tract infection) during pregnancy; Dichorionic diamniotic twin pregnancy; Diabetes mellitus during pregnancy, antepartum; Sickle cell trait (HCC); and Maternal morbid obesity, antepartum (HCC) on their problem list. ? ?Patient reports no complaints.  Contractions: Not present. Vag. Bleeding: None.  Movement: Present. Denies leaking of fluid.  ? ?The following portions of the patient's history were reviewed and updated as appropriate: allergies, current medications, past family history, past medical history, past social history, past surgical history and problem list.  ? ?Objective:  ? ?Vitals:  ? 08/10/21 1111  ?BP: 124/70  ?Pulse: 94  ?Weight: 230 lb (104.3 kg)  ? ? ?Fetal Status: Fetal Heart Rate (bpm): 150/150 on u/s   Movement: Present    ? ?General:  Alert, oriented and cooperative. Patient is in no acute distress.  ?Skin: Skin is warm and dry. No rash noted.   ?Cardiovascular: Normal heart rate noted  ?Respiratory: Normal respiratory effort, no problems with respiration noted  ?Abdomen: Soft, gravid, appropriate for gestational age.  Pain/Pressure: Present     ?Pelvic: Cervical exam deferred        ?Extremities: Normal range of motion.     ?Mental Status: Normal mood and affect. Normal behavior. Normal judgment and thought content.  ? ?Imaging: ?[place] MFM OB DETAIL +14 WK ? ?Result Date: 08/01/2021 ?----------------------------------------------------------------------  OBSTETRICS REPORT                        (Signed Final 08/01/2021 05:14 pm) ---------------------------------------------------------------------- Patient Info  ID #:       [id number]                          D.O.B.:  1995-05-18 (25 yrs)  Name:       Virginia Pearson                   Visit Date: 08/01/2021 12:51 pm              Virginia Pearson ---------------------------------------------------------------------- Performed By  Attending:        Ma Rings MD         Secondary Phy.:   [facility]                                                             [facility]  Performed By:     Tommie Raymond BS,       Address:          [address]                    [place]  [place], [place]                                                             [address]  Referred By:      Mary Sella              Location:         [facility]                               [facility]                                                             [facility]  Ref. Address:     [address]                    [place], [place]                    [address] ---------------------------------------------------------------------- Orders  #  Description                           Code        Ordered By  1  [place] MFM OB DETAIL +14 WK               [id number]    MATTHEW ECKSTAT  2  [place] MFM OB DETAIL ADDL GEST            76811.02    MATTHEW ECKSTAT     +14 WK ----------------------------------------------------------------------  #  Order #                     Accession #                Episode #  1  [id number]                   [id number]                 [id number]  2  [id number]                   [id number]                 [id number] ---------------------------------------------------------------------- Indications  Hypertension - Chronic/Pre-existing            O10.019  Obesity complicating pregnancy, second  M35.361  trimester (BMI 38)  [redacted] weeks gestation of pregnancy                Z3A.19  Elevated A1C (Pre-DM)  LR NIPS (Dizygotic)  History of sickle cell trait                   Z86.2  History of congenital or genetic condition     Z87.798  (Horseshoe kidney) ---------------------------------------------------------------------- Fetal Evaluation (Fetus A)  Num Of Fetuses:         2  Fetal Heart Rate(bpm):  160  Cardiac Activity:       Observed  Fetal Lie:              Lower Fetus  Presentation:           Cephalic  Placenta:               Anterior  P. Cord Insertion:      Visualized, central  Membrane Desc:      Dividing Membrane seen - Dichorionic.  Amniotic Fluid  AFI FV:      Within normal limits                              Largest Pocket(cm)                              5.4 ---------------------------------------------------------------------- Biometry (Fetus A)  BPD:      46.2  mm     G. Age:  20w 0d         78  %    CI:        74.19   %    70 - 86                                                          FL/HC:      17.2   %    16.1 - 18.3  HC:      170.3  mm     G. Age:  19w 4d         60  %    HC/AC:      1.20        1.09 - 1.39  AC:      142.5  mm     G. Age:  19w 4d         56  %    FL/BPD:     63.4   %  FL:       29.3  mm     G. Age:  19w 0d         33  %    FL/AC:      20.6   %    20 - 24  HUM:      28.9  mm     G. Age:  19w 3d         54  %  CER:      19.8  mm     G. Age:  19w 1d         44  %  NFT:       4.4  mm  LV:  7.9  mm  CM:        5.4  mm  Est. FW:     289  gm    0 lb 10 oz      51  %     FW Discordancy      0 \ 1 % ---------------------------------------------------------------------- OB History  Blood Type:   B+  Gravidity:    3         Term:   1        Prem:   1  Living:       2 ---------------------------------------------------------------------- Gestational Age (Fetus A)  LMP:           29w 5d        Date:  01/05/21                 EDD:   10/12/21  U/S Today:     19w 4d                                         EDD:   12/22/21  Best:          19w 2d     Det. By:  Previous Ultrasound      EDD:   12/24/21                                      (05/11/21) ---------------------------------------------------------------------- Anatomy (Fetus A)  Cranium:               Appears normal         LVOT:                   Appears normal  Cavum:                 Appears normal         Aortic Arch:            Appears normal  Ventricles:            Appears normal         Ductal Arch:            Appears normal  Choroid Plexus:        Appears normal         Diaphragm:              Appears normal  Cerebellum:            Appears normal         Stomach:                Appears normal, left                                                                        sided  Posterior Fossa:       Appears normal         Abdomen:                Appears normal  Nuchal Fold:  Appears normal         Abdominal Wall:         Appears nml (cord                                                                        insert, abd wall)  Face:                  Orbits appear          Cord Vessels:           Appears normal (3                         normal                                         vessel cord)  Lips:                  Appears normal         Kidneys:                Appear normal  Palate:                Not well visualized    Bladder:                Appears normal  Thoracic:              Appears normal         Spine:                  Not well visualized  Heart:                 Appears normal         Upper Extremities:      Appears normal                         (4CH, axis, and                         situs)  RVOT:                  Appears normal         Lower Extremities:      Appears normal  Other:  Fetus appears to be female. Nasal Bone, Lenses, Falx, Heels/feet          and open hands/5th digits visualized. VC, 3VV and 3VTV visualized.          Technically difficult due to maternal habitus and fetal position.  ---------------------------------------------------------------------- Fetal Evaluation (Fetus B)  Num Of Fetuses:         2  Fetal Heart Rate(bpm):  155  Cardiac Activity:       Observed  Fetal Lie:              Uppe

## 2021-08-25 ENCOUNTER — Encounter: Payer: Self-pay | Admitting: *Deleted

## 2021-08-30 ENCOUNTER — Ambulatory Visit: Payer: Medicaid Other | Admitting: *Deleted

## 2021-08-30 ENCOUNTER — Encounter: Payer: Self-pay | Admitting: *Deleted

## 2021-08-30 ENCOUNTER — Ambulatory Visit (INDEPENDENT_AMBULATORY_CARE_PROVIDER_SITE_OTHER): Payer: Medicaid Other | Admitting: Family Medicine

## 2021-08-30 ENCOUNTER — Other Ambulatory Visit: Payer: Self-pay | Admitting: *Deleted

## 2021-08-30 ENCOUNTER — Ambulatory Visit: Payer: Medicaid Other | Attending: Obstetrics

## 2021-08-30 VITALS — BP 118/71 | HR 95

## 2021-08-30 VITALS — BP 114/77 | HR 80 | Wt 232.8 lb

## 2021-08-30 DIAGNOSIS — O2302 Infections of kidney in pregnancy, second trimester: Secondary | ICD-10-CM | POA: Insufficient documentation

## 2021-08-30 DIAGNOSIS — D573 Sickle-cell trait: Secondary | ICD-10-CM

## 2021-08-30 DIAGNOSIS — Z789 Other specified health status: Secondary | ICD-10-CM

## 2021-08-30 DIAGNOSIS — O0990 Supervision of high risk pregnancy, unspecified, unspecified trimester: Secondary | ICD-10-CM

## 2021-08-30 DIAGNOSIS — O30042 Twin pregnancy, dichorionic/diamniotic, second trimester: Secondary | ICD-10-CM | POA: Insufficient documentation

## 2021-08-30 DIAGNOSIS — E669 Obesity, unspecified: Secondary | ICD-10-CM

## 2021-08-30 DIAGNOSIS — O10012 Pre-existing essential hypertension complicating pregnancy, second trimester: Secondary | ICD-10-CM

## 2021-08-30 DIAGNOSIS — Z6838 Body mass index (BMI) 38.0-38.9, adult: Secondary | ICD-10-CM | POA: Diagnosis present

## 2021-08-30 DIAGNOSIS — Q631 Lobulated, fused and horseshoe kidney: Secondary | ICD-10-CM

## 2021-08-30 DIAGNOSIS — Z3A23 23 weeks gestation of pregnancy: Secondary | ICD-10-CM | POA: Diagnosis not present

## 2021-08-30 DIAGNOSIS — N289 Disorder of kidney and ureter, unspecified: Secondary | ICD-10-CM

## 2021-08-30 DIAGNOSIS — O99212 Obesity complicating pregnancy, second trimester: Secondary | ICD-10-CM | POA: Diagnosis not present

## 2021-08-30 DIAGNOSIS — O10919 Unspecified pre-existing hypertension complicating pregnancy, unspecified trimester: Secondary | ICD-10-CM

## 2021-08-30 DIAGNOSIS — O24112 Pre-existing diabetes mellitus, type 2, in pregnancy, second trimester: Secondary | ICD-10-CM | POA: Insufficient documentation

## 2021-08-30 DIAGNOSIS — O30022 Conjoined twin pregnancy, second trimester: Secondary | ICD-10-CM

## 2021-08-30 DIAGNOSIS — O24119 Pre-existing diabetes mellitus, type 2, in pregnancy, unspecified trimester: Secondary | ICD-10-CM

## 2021-08-30 DIAGNOSIS — O99210 Obesity complicating pregnancy, unspecified trimester: Secondary | ICD-10-CM

## 2021-08-30 DIAGNOSIS — O10912 Unspecified pre-existing hypertension complicating pregnancy, second trimester: Secondary | ICD-10-CM

## 2021-08-30 DIAGNOSIS — Z87798 Personal history of other (corrected) congenital malformations: Secondary | ICD-10-CM

## 2021-08-30 NOTE — Patient Instructions (Addendum)
[facility] ? ?[place] [address] ?[place], [place] ? ? ?Location Hours: ? ?8:00 a. m. - 5:00 p. m. ? ?[phone] ?[phone] ? ? ?Contraception Choices ?Contraception, also called birth control, refers to methods or devices that prevent pregnancy. ?Hormonal methods ? ?Contraceptive implant ?A contraceptive implant is a thin, plastic tube that contains a hormone that prevents pregnancy. It is different from an intrauterine device (IUD). It is inserted into the upper part of the arm by a health care provider. Implants can be effective for up to 3 years. ?Progestin-only injections ?Progestin-only injections are injections of progestin, a synthetic form of the hormone progesterone. They are given every 3 months by a health care provider. ?Birth control pills ?Birth control pills are pills that contain hormones that prevent pregnancy. They must be taken once a day, preferably at the same time each day. A prescription is needed to use this method of contraception. ?Birth control patch ?The birth control patch contains hormones that prevent pregnancy. It is placed on the skin and must be changed once a week for three weeks and removed on the fourth week. A prescription is needed to use this method of contraception. ?Vaginal ring ?A vaginal ring contains hormones that prevent pregnancy. It is placed in the vagina for three weeks and removed on the fourth week. After that, the process is repeated with a new ring. A prescription is needed to use this method of contraception. ?Emergency contraceptive ?Emergency contraceptives prevent pregnancy after unprotected sex. They come in pill form and can be taken up to 5 days after sex. They work best the sooner they are taken after having sex. Most emergency contraceptives are available without a prescription. This method should not be used as your only form of birth control. ?Barrier  methods ? ?Female condom ?A female condom is a thin sheath that is worn over the penis during sex. Condoms keep sperm from going inside a woman's body. They can be used with a sperm-killing substance (spermicide) to increase their effectiveness. They should be thrown away after one use. ?Female condom ?A female condom is a soft, loose-fitting sheath that is put into the vagina before sex. The condom keeps sperm from going inside a woman's body. They should be thrown away after one use. ?Diaphragm ?A diaphragm is a soft, dome-shaped barrier. It is inserted into the vagina before sex, along with a spermicide. The diaphragm blocks sperm from entering the uterus, and the spermicide kills sperm. A diaphragm should be left in the vagina for 6-8 hours after sex and removed within 24 hours. ?A diaphragm is prescribed and fitted by a health care provider. A diaphragm should be replaced every 1-2 years, after giving birth, after gaining more than 15 lb (6.8 kg), and after pelvic surgery. ?Cervical cap ?A cervical cap is a round, soft latex or plastic cup that fits over the cervix. It is inserted into the vagina before sex, along with spermicide. It blocks sperm from entering the uterus. The cap should be left in place for 6-8 hours after sex and removed within 48 hours. A cervical cap must be prescribed and fitted by a health care provider. It should be replaced every 2 years. ?Sponge ?A sponge is a soft, circular piece of polyurethane foam with spermicide in it. The sponge helps block sperm from entering the uterus, and the spermicide kills sperm. To use it, you make it wet and then insert it into the vagina.  It should be inserted before sex, left in for at least 6 hours after sex, and removed and thrown away within 30 hours. ?Spermicides ?Spermicides are chemicals that kill or block sperm from entering the cervix and uterus. They can come as a cream, jelly, suppository, foam, or tablet. A spermicide should be inserted into the  vagina with an applicator at least [id number] minutes before sex to allow time for it to work. The process must be repeated every time you have sex. Spermicides do not require a prescription. ?Intrauterine contraception ?Intrauterine device (IUD) ?An IUD is a T-shaped device that is put in a woman's uterus. There are two types: ?Hormone IUD.This type contains progestin, a synthetic form of the hormone progesterone. This type can stay in place for 3-5 years. ?Copper IUD.This type is wrapped in copper wire. It can stay in place for 10 years. ?Permanent methods of contraception ?Female tubal ligation ?In this method, a woman's fallopian tubes are sealed, tied, or blocked during surgery to prevent eggs from traveling to the uterus. ?Hysteroscopic sterilization ?In this method, a small, flexible insert is placed into each fallopian tube. The inserts cause scar tissue to form in the fallopian tubes and block them, so sperm cannot reach an egg. The procedure takes about 3 months to be effective. Another form of birth control must be used during those 3 months. ?Female sterilization ?This is a procedure to tie off the tubes that carry sperm (vasectomy). After the procedure, the man can still ejaculate fluid (semen). Another form of birth control must be used for 3 months after the procedure. ?Natural planning methods ?Natural family planning ?In this method, a couple does not have sex on days when the woman could become pregnant. ?Calendar method ?In this method, the woman keeps track of the length of each menstrual cycle, identifies the days when pregnancy can happen, and does not have sex on those days. ?Ovulation method ?In this method, a couple avoids sex during ovulation. ?Symptothermal method ?This method involves not having sex during ovulation. The woman typically checks for ovulation by watching changes in her temperature and in the consistency of cervical mucus. ?Post-ovulation method ?In this method, a couple waits to  have sex until after ovulation. ?Where to find more information ?Centers for Disease Control and Prevention: http://www.wolf.info/ ?Summary ?Contraception, also called birth control, refers to methods or devices that prevent pregnancy. ?Hormonal methods of contraception include implants, injections, pills, patches, vaginal rings, and emergency contraceptives. ?Barrier methods of contraception can include female condoms, female condoms, diaphragms, cervical caps, sponges, and spermicides. ?There are two types of IUDs (intrauterine devices). An IUD can be put in a woman's uterus to prevent pregnancy for 3-5 years. ?Permanent sterilization can be done through a procedure for males and females. Natural family planning methods involve nothaving sex on days when the woman could become pregnant. ?This information is not intended to replace advice given to you by your health care provider. Make sure you discuss any questions you have with your health care provider. ?Document Revised: 09/29/2019 Document Reviewed: 09/29/2019 ?Elsevier Patient Education ? [place]. ? ?

## 2021-08-30 NOTE — Progress Notes (Signed)
? ?  Subjective:  ?Virginia Pearson is a 26 y.o. [phone] at [redacted]w[redacted]d being seen today for ongoing prenatal care.  She is currently monitored for the following issues for this high-risk pregnancy and has Pyelonephritis complicating pregnancy; Kidney dysfunction; Supervision of high risk pregnancy, antepartum; Language barrier; Horseshoe kidney; Chronic hypertension complicating pregnancy, antepartum; UTI (urinary tract infection) during pregnancy; Dichorionic diamniotic twin pregnancy; Diabetes mellitus during pregnancy, antepartum; Sickle cell trait ([place]); and Maternal morbid obesity, antepartum ([place]) on their problem list. ? ?Patient reports  some hip pain .  Contractions: Not present. Vag. Bleeding: None.  Movement: Present. Denies leaking of fluid.  ? ?The following portions of the patient's history were reviewed and updated as appropriate: allergies, current medications, past family history, past medical history, past social history, past surgical history and problem list. Problem list updated. ? ?Objective:  ? ?Vitals:  ? 08/30/21 1015  ?BP: 114/77  ?Pulse: 80  ?Weight: 232 lb 12.8 oz (105.6 kg)  ? ? ?Fetal Status: Fetal Heart Rate (bpm): 152/160   Movement: Present    ? ?General:  Alert, oriented and cooperative. Patient is in no acute distress.  ?Skin: Skin is warm and dry. No rash noted.   ?Cardiovascular: Normal heart rate noted  ?Respiratory: Normal respiratory effort, no problems with respiration noted  ?Abdomen: Soft, gravid, appropriate for gestational age. Pain/Pressure: Present     ?Pelvic: Vag. Bleeding: None     ?Cervical exam deferred        ?Extremities: Normal range of motion.  Edema: Trace  ?Mental Status: Normal mood and affect. Normal behavior. Normal judgment and thought content.  ? ?Urinalysis:     ? ?Assessment and Plan:  ?Pregnancy: [id number] at [redacted]w[redacted]d ? ?1. Supervision of high risk pregnancy, antepartum ?BP and FHRs normal ?Does not need to be fasting for labs at next visit ?Trialing belly  band for hip pain ? ?2. Chronic hypertension complicating pregnancy, antepartum ?On nifedipine 30 XL, ASA ?Normotensive, doing well ? ?3. 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 ?4. Horseshoe kidney ?Previously referred to Urology, reports that she has not been contacted by them ?Referral placed again, given phone number in check out paperwork ? ?5. Dichorionic diamniotic twin pregnancy in second trimester ?Has follow up growth later today ?Concordant growth on last scan ? ?6. Language barrier ?Spanish ? ?7. Maternal morbid obesity, antepartum ([place]) ? ? ?Preterm labor symptoms and general obstetric precautions including but not limited to vaginal bleeding, contractions, leaking of fluid and fetal movement were reviewed in detail with the patient. ?Please refer to After Visit Summary for other counseling recommendations.  ?Return in 4 weeks (on 09/27/2021) for [facility], ob visit, needs MD. ? ? ?Clarnce Flock, MD ? ?

## 2021-09-12 ENCOUNTER — Ambulatory Visit (INDEPENDENT_AMBULATORY_CARE_PROVIDER_SITE_OTHER): Payer: Medicaid Other | Admitting: Registered"

## 2021-09-12 ENCOUNTER — Encounter: Payer: Medicaid Other | Attending: Family Medicine | Admitting: Registered"

## 2021-09-12 DIAGNOSIS — O24919 Unspecified diabetes mellitus in pregnancy, unspecified trimester: Secondary | ICD-10-CM

## 2021-09-12 DIAGNOSIS — O24410 Gestational diabetes mellitus in pregnancy, diet controlled: Secondary | ICD-10-CM | POA: Insufficient documentation

## 2021-09-12 DIAGNOSIS — Z713 Dietary counseling and surveillance: Secondary | ICD-10-CM | POA: Diagnosis not present

## 2021-09-12 DIAGNOSIS — Z3A00 Weeks of gestation of pregnancy not specified: Secondary | ICD-10-CM | POA: Insufficient documentation

## 2021-09-12 NOTE — Progress Notes (Signed)
[facility] #[id number] ? ?[facility] video interpreter Jamison Neighbor [phone] ? ?Patient was seen for Gestational Diabetes self-management on 09/12/21  - Next OB visit is 09/28/21 ? ?Start time 1318 and End time 1413  ? ?Estimated due date: 12/24/21; [redacted]w[redacted]d ? ?Clinical: ?Medications: 08/10/21 metformin 500 mg qam / 1000 mg qhs ?Medical History: reviewed ?Labs: OGTT n/a, A1c 6.2%  ? ?SMBG: FBS 100-114 mg/dL; 353-614 mg/dL ? ?Dietary and Lifestyle History: ?Pt states she is only drinking water now, no juice. Pt states she is taking metformin as directed, doesn't seem to be brining down FBS from last visit where metformin was increased. ? ?Physical Activity: ADLs ?Stress: not assessed ?Sleep: not assessed ? ?24 hr Recall:  ?First Meal: cereal ?Snack: fruit ?Second meal: salad with grilled chicken, watermelon ?Snack: fruit ?Third meal: none "was at the park late" ?Snack: ?Beverages: water ? ?NUTRITION INTERVENTION  ?Nutrition education (E-1) on the following topics:  ? ?Initial Follow-up ? ?[]  []  Definition of Gestational Diabetes ?[]  []  Why dietary management is important in controlling blood glucose ?[x]  []  Effects each nutrient has on blood glucose levels ?[]  []  Simple carbohydrates vs complex carbohydrates ?[]  []  Fluid intake ?[x]  [x]  Creating a balanced meal plan ?[]  []  Carbohydrate counting  ?[x]  []  When to check blood glucose levels ?[x]  []  Proper blood glucose monitoring techniques ?[x]  []  Effect of stress and stress reduction techniques  ?[x]  [x]  Exercise effect on blood glucose levels, appropriate exercise during pregnancy ?[]  []  Importance of limiting caffeine and abstaining from alcohol and smoking ?[]  [x]  Medications used for blood sugar control during pregnancy - Patient experienced putting needle in her skin during today's visit just in case she will be prescribed insulin at next OB visit. ?[]  []  Hypoglycemia and rule of 15 ?[]  []  Postpartum self care ? ?Pt was nervous when insulin was mentioned. Specific advice to see if  she can control with diet and exercise: ?Eat more protein for breakfast, less cereal ?Keep fruit portion to just a handful and have protein or try vegetables for snacks ?Continue having just water for beverage choice ?Start walking 15 min in the evening ? ?Patient instructed to monitor glucose levels: ?FBS: 60 - ? 95 mg/dL (some clinics use 90 for cutoff) ?1 hour: ? 140 mg/dL ?2 hour: ? mg/dL ? ?Patient received handouts: ?Nutrition Diabetes and Pregnancy (Spanish) ?Carbohydrate Counting List ? ?Patient will be seen for follow-up as needed.  ?

## 2021-09-27 ENCOUNTER — Other Ambulatory Visit: Payer: Self-pay | Admitting: *Deleted

## 2021-09-27 ENCOUNTER — Ambulatory Visit: Payer: Medicaid Other | Admitting: *Deleted

## 2021-09-27 ENCOUNTER — Encounter: Payer: Self-pay | Admitting: *Deleted

## 2021-09-27 ENCOUNTER — Ambulatory Visit: Payer: Medicaid Other | Attending: Obstetrics and Gynecology

## 2021-09-27 VITALS — BP 123/71 | HR 101

## 2021-09-27 DIAGNOSIS — O24113 Pre-existing diabetes mellitus, type 2, in pregnancy, third trimester: Secondary | ICD-10-CM

## 2021-09-27 DIAGNOSIS — Z7984 Long term (current) use of oral hypoglycemic drugs: Secondary | ICD-10-CM

## 2021-09-27 DIAGNOSIS — O2302 Infections of kidney in pregnancy, second trimester: Secondary | ICD-10-CM

## 2021-09-27 DIAGNOSIS — O0990 Supervision of high risk pregnancy, unspecified, unspecified trimester: Secondary | ICD-10-CM

## 2021-09-27 DIAGNOSIS — N289 Disorder of kidney and ureter, unspecified: Secondary | ICD-10-CM | POA: Diagnosis present

## 2021-09-27 DIAGNOSIS — O10012 Pre-existing essential hypertension complicating pregnancy, second trimester: Secondary | ICD-10-CM | POA: Diagnosis not present

## 2021-09-27 DIAGNOSIS — O30042 Twin pregnancy, dichorionic/diamniotic, second trimester: Secondary | ICD-10-CM

## 2021-09-27 DIAGNOSIS — O288 Other abnormal findings on antenatal screening of mother: Secondary | ICD-10-CM

## 2021-09-27 DIAGNOSIS — O24112 Pre-existing diabetes mellitus, type 2, in pregnancy, second trimester: Secondary | ICD-10-CM | POA: Diagnosis not present

## 2021-09-27 DIAGNOSIS — D573 Sickle-cell trait: Secondary | ICD-10-CM | POA: Diagnosis present

## 2021-09-27 DIAGNOSIS — O10919 Unspecified pre-existing hypertension complicating pregnancy, unspecified trimester: Secondary | ICD-10-CM

## 2021-09-27 DIAGNOSIS — E669 Obesity, unspecified: Secondary | ICD-10-CM

## 2021-09-27 DIAGNOSIS — O09212 Supervision of pregnancy with history of pre-term labor, second trimester: Secondary | ICD-10-CM

## 2021-09-27 DIAGNOSIS — Z789 Other specified health status: Secondary | ICD-10-CM

## 2021-09-27 DIAGNOSIS — O99212 Obesity complicating pregnancy, second trimester: Secondary | ICD-10-CM | POA: Diagnosis not present

## 2021-09-27 DIAGNOSIS — Z3A27 27 weeks gestation of pregnancy: Secondary | ICD-10-CM

## 2021-09-28 ENCOUNTER — Ambulatory Visit (INDEPENDENT_AMBULATORY_CARE_PROVIDER_SITE_OTHER): Payer: Medicaid Other | Admitting: Family Medicine

## 2021-09-28 ENCOUNTER — Encounter: Payer: Self-pay | Admitting: Family Medicine

## 2021-09-28 VITALS — BP 114/77 | HR 85 | Wt 233.8 lb

## 2021-09-28 DIAGNOSIS — Z23 Encounter for immunization: Secondary | ICD-10-CM

## 2021-09-28 DIAGNOSIS — Z789 Other specified health status: Secondary | ICD-10-CM

## 2021-09-28 DIAGNOSIS — O24119 Pre-existing diabetes mellitus, type 2, in pregnancy, unspecified trimester: Secondary | ICD-10-CM

## 2021-09-28 DIAGNOSIS — O0990 Supervision of high risk pregnancy, unspecified, unspecified trimester: Secondary | ICD-10-CM

## 2021-09-28 DIAGNOSIS — Q631 Lobulated, fused and horseshoe kidney: Secondary | ICD-10-CM

## 2021-09-28 DIAGNOSIS — O99210 Obesity complicating pregnancy, unspecified trimester: Secondary | ICD-10-CM

## 2021-09-28 DIAGNOSIS — O24919 Unspecified diabetes mellitus in pregnancy, unspecified trimester: Secondary | ICD-10-CM

## 2021-09-28 DIAGNOSIS — O30043 Twin pregnancy, dichorionic/diamniotic, third trimester: Secondary | ICD-10-CM

## 2021-09-28 DIAGNOSIS — O10919 Unspecified pre-existing hypertension complicating pregnancy, unspecified trimester: Secondary | ICD-10-CM

## 2021-09-28 MED ORDER — CYCLOBENZAPRINE HCL 10 MG PO TABS: 10.0000 mg | ORAL_TABLET | Freq: Three times a day (TID) | ORAL | 2 refills | Status: DC | PRN

## 2021-09-28 MED ORDER — METFORMIN HCL 1000 MG PO TABS: 1000.0000 mg | ORAL_TABLET | Freq: Two times a day (BID) | ORAL | 5 refills | Status: DC

## 2021-09-28 MED ORDER — INSULIN NPH (HUMAN) (ISOPHANE) 100 UNIT/ML ~~LOC~~ SUSP: 10.0000 [IU] | Freq: Two times a day (BID) | SUBCUTANEOUS | 3 refills | Status: DC

## 2021-09-28 MED ORDER — PREPLUS 27-1 MG PO TABS: 1.0000 | ORAL_TABLET | Freq: Every day | ORAL | 6 refills | Status: DC

## 2021-09-28 MED ORDER — "INSULIN SYRINGE 31G X 5/16"" 0.5 ML MISC": 1.0000 | Freq: Two times a day (BID) | 11 refills | Status: DC

## 2021-09-28 NOTE — Patient Instructions (Addendum)
Safe Medications in Pregnancy   Acne:  Benzoyl Peroxide  Salicylic Acid   Backache/Headache:  Tylenol: 2 regular strength every 4 hours OR               2 Extra strength every 6 hours   Colds/Coughs/Allergies:  Benadryl (alcohol free) 25 mg every 6 hours as needed  Breath right strips  Claritin  Cepacol throat lozenges  Chloraseptic throat spray  Cold-Eeze- up to three times per day  Cough drops, alcohol free  Flonase (by prescription only)  Guaifenesin  Mucinex  Robitussin DM (plain only, alcohol free)  Saline nasal spray/drops  Sudafed (pseudoephedrine) & Actifed * use only after [redacted] weeks gestation and if you do not have high blood pressure  Tylenol  Vicks Vaporub  Zinc lozenges  Zyrtec   Constipation:  Colace  Ducolax suppositories  Fleet enema  Glycerin suppositories  Metamucil  Milk of magnesia  Miralax  Senokot  Smooth move tea   Diarrhea:  Kaopectate  Imodium A-D   *NO pepto Bismol   Hemorrhoids:  Anusol  Anusol HC  Preparation H  Tucks   Indigestion:  Tums  Maalox  Mylanta  Zantac  Pepcid   Insomnia:  Benadryl (alcohol free) 25mg  every 6 hours as needed  Tylenol PM  Unisom, no Gelcaps   Leg Cramps:  Tums  MagGel   Nausea/Vomiting:  Bonine  Dramamine  Emetrol  Ginger extract  Sea bands  Meclizine  Nausea medication to take during pregnancy:  Unisom (doxylamine succinate 25 mg tablets) Take one tablet daily at bedtime. If symptoms are not adequately controlled, the dose can be increased to a maximum recommended dose of two tablets daily (1/2 tablet in the morning, 1/2 tablet mid-afternoon and one at bedtime).  Vitamin B6 100mg  tablets. Take one tablet twice a day (up to 200 mg per day).   Skin Rashes:  Aveeno products  Benadryl cream or 25mg  every 6 hours as needed  Calamine Lotion  1% cortisone cream   Yeast infection:  Gyne-lotrimin 7  Monistat 7    **If taking multiple medications, please check labels to avoid  duplicating the same active ingredients  **take medication as directed on the label  ** Do not exceed 4000 mg of tylenol in 24 hours  **Do not take medications that contain aspirin or ibuprofen           Eleccin del mtodo anticonceptivo Contraception Choices La anticoncepcin, o los mtodos anticonceptivos, hace referencia a los mtodos o dispositivos que evitan el [place]barazo. Mtodos hormonales  Implante anticonceptivo Un implante anticonceptivo consiste en un tubo delgado de plstico que contiene una hormona que evita el [place]. Es diferente de un dispositivo intrauterino (DIU). Un mdico lo inserta en la parte superior del brazo. Los implantes pueden ser eficaces durante un mximo de 3 aos. Inyecciones de progestina sola Las inyecciones de progestina sola contienen progestina, una forma sinttica de la hormona progesterona. Un mdico las administra cada 3 meses. Pldoras anticonceptivas Las pldoras anticonceptivas son pastillas que contienen hormonas que evitan el [place]embarazo. Deben tomarse una vez al da, preferentemente a la misma Economisthora cada da. Se necesita una receta para utilizar este mtodo anticonceptivo. Parche anticonceptivo El parche anticonceptivo contiene hormonas que evitan el [place]barazo. Se coloca en la piel, debe cambiarse una vez a la semana durante tres semanas y debe retirarse en la cuarta semana. Se necesita una receta para utilizar este mtodo anticonceptivo. Anillo vaginal Un anillo vaginal contiene hormonas que evitan el embarazo. Se coloca  en la vagina durante tres semanas y se retira en la cuarta semana. Luego se repite el proceso con un anillo nuevo. Se necesita una receta para utilizar este mtodo anticonceptivo. Anticonceptivo de emergencia Los anticonceptivos de emergencia son mtodos para evitar un embarazo despus de Warehouse manager sexo sin proteccin. Vienen en forma de pldora y pueden tomarse hasta 5 das despus de [place]. Funcionan mejor cuando se toman lo ms  pronto posible luego de [employer]. La mayora de los anticonceptivos de emergencia estn disponibles sin receta mdica. Este mtodo no debe utilizarse como el nico mtodo anticonceptivo. Mtodos de barrera  Condn masculino Un condn masculino es una vaina delgada que se coloca sobre el pene durante el sexo. Los condones evitan que el esperma ingrese en el cuerpo de la [place]. Pueden utilizarse con un una sustancia que mata a los espermatozoides (espermicida) para aumentar la efectividad. Deben desecharse despus de un uso. Condn femenino Un condn femenino es una vaina blanda y holgada que se coloca en la vagina antes de [employer]. El condn evita que el esperma ingrese en el cuerpo de la [place]. Deben desecharse despus de un uso. Diafragma Un diafragma es una barrera blanda con forma de cpula. Se inserta en la vagina antes del sexo, junto con un espermicida. El diafragma bloquea el ingreso de esperma en el tero, y el espermicida mata a los espermatozoides. El Designer, fashion/clothing en la vagina durante 6 a 8 horas despus de Warehouse manager sexo y debe retirarse en el plazo de las 24 horas. Un diafragma es recetado y colocado por un mdico. Debe reemplazarse cada 1 a 2 aos, despus de dar a luz, de aumentar ms de 15lb (6.8kg) y de [place] plvica. Capuchn cervical Un capuchn cervical es una copa redonda y blanda de ltex o plstico que se coloca en el cuello uterino. Se inserta en la vagina antes del sexo, junto con un espermicida. Bloquea el ingreso del esperma en el tero. El capuchn Radio producer durante 6 a 8 horas despus de Warehouse manager sexo y debe retirarse en el plazo de las 48 horas. Un capuchn cervical debe ser recetado y colocado por un mdico. Debe reemplazarse cada 2aos. Esponja Una esponja es una pieza blanda y circular de espuma de poliuretano que contiene espermicida. La esponja ayuda a bloquear el ingreso de esperma en el tero, y el espermicida mata a los  espermatozoides. Belva Bertin, debe humedecerla e insertarla en la vagina. Debe insertarse antes de [employer], debe permanecer dentro al menos durante 6 horas despus de tener sexo y debe retirarse y Nurse, adult en el plazo de las 30 horas. Espermicidas Los espermicidas son sustancias qumicas que matan o bloquean al esperma y no lo dejan ingresar al cuello uterino y al tero. Vienen en forma de crema, gel, supositorio, espuma o comprimido. Un espermicida debe insertarse en la vagina con un aplicador al menos 10 o 15 minutos antes de tener sexo para dar tiempo a que surta [place]. El proceso debe repetirse cada vez que tenga sexo. Los espermicidas no requieren Emergency planning/management officer. Anticonceptivos intrauterinos Dispositivo intrauterino (DIU) Un DIU es un dispositivo en forma de T que se coloca en el tero. Existen dos tipos: DIU hormonal.Este tipo contiene progestina, una forma sinttica de la hormona progesterona. Este tipo puede permanecer colocado durante 3 a 5 aos. DIU de cobre.Este tipo est recubierto con un alambre de cobre. Puede permanecer colocado durante 10 aos. Mtodos anticonceptivos permanentes Ligadura de trompas en la mujer  En este mtodo, se sellan, atan u obstruyen las trompas de Falopio durante [place] para Automotive engineer que el vulo descienda hacia el tero. Esterilizacin histeroscpica En este mtodo, se coloca un implante pequeo y flexible dentro de cada trompa de Falopio. Los implantes hacen que se forme un tejido cicatricial en las trompas de Falopio y que las obstruya para que el espermatozoide no pueda llegar al vulo. El procedimiento demora alrededor de 3 meses para que sea [place]. Debe utilizarse otro mtodo anticonceptivo durante esos 3 meses. Esterilizacin masculina Este es un procedimiento que consiste en atar los conductos que transportan el esperma (vasectoma). Luego del procedimiento, el hombre Manufacturing engineer lquido (semen). Debe utilizarse otro mtodo anticonceptivo  durante 3 meses despus del procedimiento. Mtodos de planificacin natural Planificacin familiar natural En este mtodo, la pareja no tiene [employer] la mujer podra quedar [place]. Mtodo calendario En este mtodo, la mujer realiza un seguimiento de la duracin de cada ciclo menstrual, identifica los [employer] que se puede producir un Psychiatrist y no tiene sexo durante esos [address]  [address]. Mtodo de la ovulacin En este mtodo, la pareja evita tener sexo durante la ovulacin. Mtodo sintotrmico Este mtodo implica no tener sexo durante la ovulacin. Normalmente, la mujer comprueba la ovulacin al observar cambios en su temperatura y en la consistencia del moco cervical. Mtodo posovulacin En este mtodo, la pareja espera a que finalice la ovulacin para Doctor, hospital. Dnde buscar ms informacin Centers for Disease Control and Prevention (Centros para el Control y Psychiatrist de Event organiser): [email or web address] Resumen La anticoncepcin, o los mtodos anticonceptivos, hace referencia a los mtodos o dispositivos que evitan el [place]. Los mtodos anticonceptivos hormonales incluyen implantes, inyecciones, pastillas, parches, anillos vaginales y anticonceptivos de Associate Professor. Los mtodos anticonceptivos de barrera pueden incluir condones masculinos, condones femeninos, diafragmas, capuchones cervicales, esponjas y espermicidas. [employer] tipos de DIU (dispositivo intrauterino). Un DIU puede colocarse en el tero de una mujer para evitar el embarazo durante 3 a 5 aos. La esterilizacin permanente puede realizarse mediante un procedimiento tanto en los hombres como en las mujeres. Los [employer] de Medical sales representative natural implican no tener [employer] la mujer podra quedar [place]. Esta informacin no tiene Theme park manager el consejo del mdico. Asegrese de hacerle al mdico cualquier pregunta que tenga. Document Revised: 11/25/2019 Document Reviewed:  11/25/2019 Elsevier Patient Education  2023 [employer].

## 2021-09-28 NOTE — Progress Notes (Signed)
   Subjective:  Virginia Pearson is a 26 y.o. [phone] at [redacted]w[redacted]d being seen today for ongoing prenatal care.  She is currently monitored for the following issues for this high-risk pregnancy and has Pyelonephritis complicating pregnancy; Kidney dysfunction; Supervision of high risk pregnancy, antepartum; Language barrier; Horseshoe kidney; Chronic hypertension complicating pregnancy, antepartum; UTI (urinary tract infection) during pregnancy; Dichorionic diamniotic twin pregnancy; Diabetes mellitus during pregnancy, antepartum; Sickle cell trait ([place]); and Maternal morbid obesity, antepartum ([place]) on their problem list.  Patient reports no complaints.  Contractions: Not present. Vag. Bleeding: None.  Movement: Present. Denies leaking of fluid.   The following portions of the patient's history were reviewed and updated as appropriate: allergies, current medications, past family history, past medical history, past social history, past surgical history and problem list. Problem list updated.  Objective:   Vitals:   09/28/21 1052  BP: 114/77  Pulse: 85  Weight: 233 lb 12.8 oz (106.1 kg)    Fetal Status: Fetal Heart Rate (bpm): 140/150   Movement: Present     General:  Alert, oriented and cooperative. Patient is in no acute distress.  Skin: Skin is warm and dry. No rash noted.   Cardiovascular: Normal heart rate noted  Respiratory: Normal respiratory effort, no problems with respiration noted  Abdomen: Soft, gravid, appropriate for gestational age. Pain/Pressure: Present     Pelvic: Vag. Bleeding: None     Cervical exam deferred        Extremities: Normal range of motion.  Edema: Trace  Mental Status: Normal mood and affect. Normal behavior. Normal judgment and thought content.   Urinalysis:        Assessment and Plan:  Pregnancy: G3P1102 at [redacted]w[redacted]d  1. Supervision of high risk pregnancy, antepartum BP and FHRs normal Third trimester labs today TDAP given  2. Chronic hypertension  complicating pregnancy, antepartum On procardia 30 XL daily and ASA Normotensive today  3. Diabetes mellitus during pregnancy, antepartum, unspecified diabetes mellitus type Sugars not well controlled, fastings are 100% NOT at goal, post prandials have many missing values and are only 60% at goal Started on metformin 500g qAM and 1g qHS Discussed need to start insulin, will start NPH 10u BID Following w MFM for growth scans, normal to date  4. Horseshoe kidney Refer to Urology in the past, has not been, have discussed numerous times  5. Language barrier Spanish  6. Dichorionic diamniotic twin pregnancy in third trimester Following w MFM Normal growth and no significant discordance to date  7. Maternal morbid obesity, antepartum ([place])   Preterm labor symptoms and general obstetric precautions including but not limited to vaginal bleeding, contractions, leaking of fluid and fetal movement were reviewed in detail with the patient. Please refer to After Visit Summary for other counseling recommendations.  Return in 2 weeks (on 10/12/2021) for ob visit, HRC, needs MD.   Clarnce Flock, MD

## 2021-09-29 ENCOUNTER — Other Ambulatory Visit: Payer: Self-pay | Admitting: *Deleted

## 2021-09-29 DIAGNOSIS — O10912 Unspecified pre-existing hypertension complicating pregnancy, second trimester: Secondary | ICD-10-CM

## 2021-09-29 DIAGNOSIS — R638 Other symptoms and signs concerning food and fluid intake: Secondary | ICD-10-CM

## 2021-09-29 DIAGNOSIS — O30042 Twin pregnancy, dichorionic/diamniotic, second trimester: Secondary | ICD-10-CM

## 2021-09-29 DIAGNOSIS — O24112 Pre-existing diabetes mellitus, type 2, in pregnancy, second trimester: Secondary | ICD-10-CM

## 2021-09-29 LAB — RPR: RPR Ser Ql: NONREACTIVE

## 2021-09-29 LAB — CBC
Hematocrit: 29.5 % — ABNORMAL LOW (ref 34.0–46.6)
Hemoglobin: 10.1 g/dL — ABNORMAL LOW (ref 11.1–15.9)
MCH: 25.2 pg — ABNORMAL LOW (ref 26.6–33.0)
MCHC: 34.2 g/dL (ref 31.5–35.7)
MCV: 74 fL — ABNORMAL LOW (ref 79–97)
Platelets: 400 10*3/uL (ref 150–450)
RBC: 4.01 x10E6/uL (ref 3.77–5.28)
RDW: 13.6 % (ref 11.7–15.4)
WBC: 10.2 10*3/uL (ref 3.4–10.8)

## 2021-09-29 LAB — HIV ANTIBODY (ROUTINE TESTING W REFLEX): HIV Screen 4th Generation wRfx: NONREACTIVE

## 2021-09-30 ENCOUNTER — Other Ambulatory Visit: Payer: Self-pay | Admitting: Family Medicine

## 2021-09-30 DIAGNOSIS — O24119 Pre-existing diabetes mellitus, type 2, in pregnancy, unspecified trimester: Secondary | ICD-10-CM

## 2021-09-30 MED ORDER — METFORMIN HCL 1000 MG PO TABS
500.0000 mg | ORAL_TABLET | Freq: Two times a day (BID) | ORAL | 5 refills | Status: DC
Start: 2021-09-30 — End: 2021-10-12

## 2021-10-04 ENCOUNTER — Encounter: Payer: Medicaid Other | Attending: Family Medicine | Admitting: Registered"

## 2021-10-04 ENCOUNTER — Ambulatory Visit (INDEPENDENT_AMBULATORY_CARE_PROVIDER_SITE_OTHER): Payer: Medicaid Other | Admitting: Registered"

## 2021-10-04 DIAGNOSIS — Z029 Encounter for administrative examinations, unspecified: Secondary | ICD-10-CM | POA: Diagnosis present

## 2021-10-04 DIAGNOSIS — O24919 Unspecified diabetes mellitus in pregnancy, unspecified trimester: Secondary | ICD-10-CM

## 2021-10-04 NOTE — Progress Notes (Signed)
Insulin Instruction In-person interpreter Raquel from [facility].  Patient was seen on 09/3021 for insulin instruction.   MD orders are:  NPH 10 units bid with meals continue metformin 500 mg am; 1000 mg pm  Pt states she was told to take 2 pills am & pm at last visit. RD reviewed metformin Rx history and Dr. Dione Plover notes. Pt was advised to take metformin as prescribed, 500 am morning and 1000 mg night.  The following learning objectives were met by the patient during this visit:   Insulin Action of NPH insulins  Reviewed syringe & vial including # units per syringe and vial  Hygiene and storage  Drawing up single doses using vials  Rotation of Sites  Hypoglycemia- symptoms, causes, treatment choices  Record keeping and MD follow up  Patient demonstrated understanding of insulin administration by return demonstration.  Patient received the following handouts in Spanish: Insulin Instruction Dosing schedule picture Low blood sugar Rotation of sights (English with picture)                                        Patient to start on insulin as Rx'd by MD  Pt states she has a history of not being able to pick up prescriptions at pharmacy. RD called pharmacy to confirm prescriptions are ready.  Patient will be seen for follow-up as needed.

## 2021-10-12 ENCOUNTER — Ambulatory Visit (INDEPENDENT_AMBULATORY_CARE_PROVIDER_SITE_OTHER): Payer: Medicaid Other | Admitting: Family Medicine

## 2021-10-12 VITALS — BP 130/82 | HR 96 | Wt 238.4 lb

## 2021-10-12 DIAGNOSIS — O24119 Pre-existing diabetes mellitus, type 2, in pregnancy, unspecified trimester: Secondary | ICD-10-CM

## 2021-10-12 DIAGNOSIS — O0990 Supervision of high risk pregnancy, unspecified, unspecified trimester: Secondary | ICD-10-CM

## 2021-10-12 DIAGNOSIS — D573 Sickle-cell trait: Secondary | ICD-10-CM

## 2021-10-12 DIAGNOSIS — Q631 Lobulated, fused and horseshoe kidney: Secondary | ICD-10-CM

## 2021-10-12 DIAGNOSIS — O24919 Unspecified diabetes mellitus in pregnancy, unspecified trimester: Secondary | ICD-10-CM

## 2021-10-12 DIAGNOSIS — O30043 Twin pregnancy, dichorionic/diamniotic, third trimester: Secondary | ICD-10-CM

## 2021-10-12 DIAGNOSIS — Z789 Other specified health status: Secondary | ICD-10-CM

## 2021-10-12 DIAGNOSIS — O99210 Obesity complicating pregnancy, unspecified trimester: Secondary | ICD-10-CM

## 2021-10-12 DIAGNOSIS — O10919 Unspecified pre-existing hypertension complicating pregnancy, unspecified trimester: Secondary | ICD-10-CM

## 2021-10-12 DIAGNOSIS — N289 Disorder of kidney and ureter, unspecified: Secondary | ICD-10-CM

## 2021-10-12 DIAGNOSIS — O2302 Infections of kidney in pregnancy, second trimester: Secondary | ICD-10-CM

## 2021-10-12 MED ORDER — INSULIN NPH (HUMAN) (ISOPHANE) 100 UNIT/ML ~~LOC~~ SUSP: 12.0000 [IU] | Freq: Two times a day (BID) | SUBCUTANEOUS | 3 refills | Status: DC

## 2021-10-12 MED ORDER — METFORMIN HCL 1000 MG PO TABS: 1000.0000 mg | ORAL_TABLET | Freq: Two times a day (BID) | ORAL | 5 refills | Status: DC

## 2021-10-12 NOTE — Progress Notes (Signed)
   Subjective:  Virginia Pearson is a 26 y.o. [phone] at [redacted]w[redacted]d being seen today for ongoing prenatal care.  She is currently monitored for the following issues for this high-risk pregnancy and has Pyelonephritis complicating pregnancy; Kidney dysfunction; Supervision of high risk pregnancy, antepartum; Language barrier; Horseshoe kidney; Chronic hypertension complicating pregnancy, antepartum; UTI (urinary tract infection) during pregnancy; Dichorionic diamniotic twin pregnancy; Diabetes mellitus during pregnancy, antepartum; Sickle cell trait ([place]); and Maternal morbid obesity, antepartum ([place]) on their problem list.  Patient reports no complaints.  Contractions: Not present. Vag. Bleeding: None.  Movement: Present. Denies leaking of fluid.   The following portions of the patient's history were reviewed and updated as appropriate: allergies, current medications, past family history, past medical history, past social history, past surgical history and problem list. Problem list updated.  Objective:   Vitals:   10/12/21 0836  BP: 130/82  Pulse: 96  Weight: 238 lb 6.4 oz (108.1 kg)    Fetal Status: Fetal Heart Rate (bpm): 145/146   Movement: Present     General:  Alert, oriented and cooperative. Patient is in no acute distress.  Skin: Skin is warm and dry. No rash noted.   Cardiovascular: Normal heart rate noted  Respiratory: Normal respiratory effort, no problems with respiration noted  Abdomen: Soft, gravid, appropriate for gestational age. Pain/Pressure: Present     Pelvic: Vag. Bleeding: None     Cervical exam deferred        Extremities: Normal range of motion.  Edema: Trace  Mental Status: Normal mood and affect. Normal behavior. Normal judgment and thought content.   Urinalysis:          Assessment and Plan:  Pregnancy: G3P1102 at [redacted]w[redacted]d  1. Supervision of high risk pregnancy, antepartum BP and FHRs normal Discussed contraception, leaning towards Depo  2. Language  barrier Spanish  3. Sickle cell trait ([place])   4. Dichorionic diamniotic twin pregnancy in third trimester Following w MFM, normal EFW and 1% discordance on last scan  5. Maternal morbid obesity, antepartum ([place])   6. Horseshoe kidney Referred to Urology previously, has not been  7. Chronic hypertension complicating pregnancy, antepartum On Nifedipine 30 XL, normotensive today On ASA  8. Diabetes mellitus during pregnancy, antepartum, unspecified diabetes mellitus type Started on metformin 500 qAM and 1g qPM last visit as well as NPH 10u BID See picture from last two logs above Overall still not at goal but definitely improving Will increase NPH to 12u BID and make metformin 1g BID Following w MFM for growth scans already  Preterm labor symptoms and general obstetric precautions including but not limited to vaginal bleeding, contractions, leaking of fluid and fetal movement were reviewed in detail with the patient. Please refer to After Visit Summary for other counseling recommendations.  Return in 2 weeks (on 10/26/2021) for [facility], ob visit, needs MD.   Clarnce Flock, MD

## 2021-10-12 NOTE — Patient Instructions (Signed)
Eleccin del mtodo anticonceptivo Contraception Choices La anticoncepcin, o los mtodos anticonceptivos, hace referencia a los mtodos o dispositivos que evitan el embarazo. Mtodos hormonales  Implante anticonceptivo Un implante anticonceptivo consiste en un tubo delgado de plstico que contiene una hormona que evita el embarazo. Es diferente de un dispositivo intrauterino (DIU). Un mdico lo inserta en la parte superior del brazo. Los implantes pueden ser eficaces durante un mximo de 3 aos. Inyecciones de progestina sola Las inyecciones de progestina sola contienen progestina, una forma sinttica de la hormona progesterona. Un mdico las administra cada 3 meses. Pldoras anticonceptivas Las pldoras anticonceptivas son pastillas que contienen hormonas que evitan el embarazo. Deben tomarse una vez al da, preferentemente a la misma hora cada da. Se necesita una receta para utilizar este mtodo anticonceptivo. Parche anticonceptivo El parche anticonceptivo contiene hormonas que evitan el embarazo. Se coloca en la piel, debe cambiarse una vez a la semana durante tres semanas y debe retirarse en la cuarta semana. Se necesita una receta para utilizar este mtodo anticonceptivo. Anillo vaginal Un anillo vaginal contiene hormonas que evitan el embarazo. Se coloca en la vagina durante tres semanas y se retira en la cuarta semana. Luego se repite el proceso con un anillo nuevo. Se necesita una receta para utilizar este mtodo anticonceptivo. Anticonceptivo de emergencia Los anticonceptivos de emergencia son mtodos para evitar un embarazo despus de tener sexo sin proteccin. Vienen en forma de pldora y pueden tomarse hasta 5 das despus de tener sexo. Funcionan mejor cuando se toman lo ms pronto posible luego de tener sexo. La mayora de los anticonceptivos de emergencia estn disponibles sin receta mdica. Este mtodo no debe utilizarse como el nico mtodo anticonceptivo. Mtodos de  barrera  Condn masculino Un condn masculino es una vaina delgada que se coloca sobre el pene durante el sexo. Los condones evitan que el esperma ingrese en el cuerpo de la mujer. Pueden utilizarse con un una sustancia que mata a los espermatozoides (espermicida) para aumentar la efectividad. Deben desecharse despus de un uso. Condn femenino Un condn femenino es una vaina blanda y holgada que se coloca en la vagina antes de tener sexo. El condn evita que el esperma ingrese en el cuerpo de la mujer. Deben desecharse despus de un uso. Diafragma Un diafragma es una barrera blanda con forma de cpula. Se inserta en la vagina antes del sexo, junto con un espermicida. El diafragma bloquea el ingreso de esperma en el tero, y el espermicida mata a los espermatozoides. El diafragma debe permanecer en la vagina durante 6 a 8 horas despus de tener sexo y debe retirarse en el plazo de las 24 horas. Un diafragma es recetado y colocado por un mdico. Debe reemplazarse cada 1 a 2 aos, despus de dar a luz, de aumentar ms de 15lb (6.8kg) y de una ciruga plvica. Capuchn cervical Un capuchn cervical es una copa redonda y blanda de ltex o plstico que se coloca en el cuello uterino. Se inserta en la vagina antes del sexo, junto con un espermicida. Bloquea el ingreso del esperma en el tero. El capuchn debe permanecer en el lugar durante 6 a 8 horas despus de tener sexo y debe retirarse en el plazo de las 48 horas. Un capuchn cervical debe ser recetado y colocado por un mdico. Debe reemplazarse cada 2aos. Esponja Una esponja es una pieza blanda y circular de espuma de poliuretano que contiene espermicida. La esponja ayuda a bloquear el ingreso de esperma en el tero, y el espermicida   mata a los espermatozoides. Para utilizarla, debe humedecerla e insertarla en la vagina. Debe insertarse antes de tener sexo, debe permanecer dentro al menos durante 6 horas despus de tener sexo y debe retirarse y  desecharse en el plazo de las 30 horas. Espermicidas Los espermicidas son sustancias qumicas que matan o bloquean al esperma y no lo dejan ingresar al cuello uterino y al tero. Vienen en forma de crema, gel, supositorio, espuma o comprimido. Un espermicida debe insertarse en la vagina con un aplicador al menos 10 o 15 minutos antes de tener sexo para dar tiempo a que surta efecto. El proceso debe repetirse cada vez que tenga sexo. Los espermicidas no requieren receta mdica. Anticonceptivos intrauterinos Dispositivo intrauterino (DIU) Un DIU es un dispositivo en forma de T que se coloca en el tero. Existen dos tipos: DIU hormonal.Este tipo contiene progestina, una forma sinttica de la hormona progesterona. Este tipo puede permanecer colocado durante 3 a 5 aos. DIU de cobre.Este tipo est recubierto con un alambre de cobre. Puede permanecer colocado durante 10 aos. Mtodos anticonceptivos permanentes Ligadura de trompas en la mujer En este mtodo, se sellan, atan u obstruyen las trompas de Falopio durante una ciruga para evitar que el vulo descienda hacia el tero. Esterilizacin histeroscpica En este mtodo, se coloca un implante pequeo y flexible dentro de cada trompa de Falopio. Los implantes hacen que se forme un tejido cicatricial en las trompas de Falopio y que las obstruya para que el espermatozoide no pueda llegar al vulo. El procedimiento demora alrededor de 3 meses para que sea efectivo. Debe utilizarse otro mtodo anticonceptivo durante esos 3 meses. Esterilizacin masculina Este es un procedimiento que consiste en atar los conductos que transportan el esperma (vasectoma). Luego del procedimiento, el hombre puede eyacular lquido (semen). Debe utilizarse otro mtodo anticonceptivo durante 3 meses despus del procedimiento. Mtodos de planificacin natural Planificacin familiar natural En este mtodo, la pareja no tiene sexo durante los das en que la mujer podra quedar  embarazada. Mtodo calendario En este mtodo, la mujer realiza un seguimiento de la duracin de cada ciclo menstrual, identifica los das en los que se puede producir un embarazo y no tiene sexo durante esos das. Mtodo de la ovulacin En este mtodo, la pareja evita tener sexo durante la ovulacin. Mtodo sintotrmico Este mtodo implica no tener sexo durante la ovulacin. Normalmente, la mujer comprueba la ovulacin al observar cambios en su temperatura y en la consistencia del moco cervical. Mtodo posovulacin En este mtodo, la pareja espera a que finalice la ovulacin para tener sexo. Dnde buscar ms informacin Centers for Disease Control and Prevention (Centros para el Control y la Prevencin de Enfermedades): www.cdc.gov Resumen La anticoncepcin, o los mtodos anticonceptivos, hace referencia a los mtodos o dispositivos que evitan el embarazo. Los mtodos anticonceptivos hormonales incluyen implantes, inyecciones, pastillas, parches, anillos vaginales y anticonceptivos de emergencia. Los mtodos anticonceptivos de barrera pueden incluir condones masculinos, condones femeninos, diafragmas, capuchones cervicales, esponjas y espermicidas. Existen dos tipos de DIU (dispositivo intrauterino). Un DIU puede colocarse en el tero de una mujer para evitar el embarazo durante 3 a 5 aos. La esterilizacin permanente puede realizarse mediante un procedimiento tanto en los hombres como en las mujeres. Los mtodos de planificacin familiar natural implican no tener sexo durante los das en que la mujer podra quedar embarazada. Esta informacin no tiene como fin reemplazar el consejo del mdico. Asegrese de hacerle al mdico cualquier pregunta que tenga. Document Revised: 11/25/2019 Document Reviewed: 11/25/2019 Elsevier Patient Education    2023 Elsevier Inc.  

## 2021-10-25 ENCOUNTER — Ambulatory Visit: Payer: Medicaid Other | Admitting: *Deleted

## 2021-10-25 ENCOUNTER — Ambulatory Visit: Payer: Medicaid Other | Attending: Obstetrics and Gynecology

## 2021-10-25 VITALS — BP 120/85 | HR 103

## 2021-10-25 DIAGNOSIS — E669 Obesity, unspecified: Secondary | ICD-10-CM

## 2021-10-25 DIAGNOSIS — Z7984 Long term (current) use of oral hypoglycemic drugs: Secondary | ICD-10-CM

## 2021-10-25 DIAGNOSIS — O0990 Supervision of high risk pregnancy, unspecified, unspecified trimester: Secondary | ICD-10-CM | POA: Insufficient documentation

## 2021-10-25 DIAGNOSIS — N289 Disorder of kidney and ureter, unspecified: Secondary | ICD-10-CM | POA: Diagnosis present

## 2021-10-25 DIAGNOSIS — O10912 Unspecified pre-existing hypertension complicating pregnancy, second trimester: Secondary | ICD-10-CM | POA: Diagnosis present

## 2021-10-25 DIAGNOSIS — O10013 Pre-existing essential hypertension complicating pregnancy, third trimester: Secondary | ICD-10-CM | POA: Diagnosis not present

## 2021-10-25 DIAGNOSIS — O2303 Infections of kidney in pregnancy, third trimester: Secondary | ICD-10-CM

## 2021-10-25 DIAGNOSIS — O24113 Pre-existing diabetes mellitus, type 2, in pregnancy, third trimester: Secondary | ICD-10-CM

## 2021-10-25 DIAGNOSIS — O30042 Twin pregnancy, dichorionic/diamniotic, second trimester: Secondary | ICD-10-CM | POA: Diagnosis present

## 2021-10-25 DIAGNOSIS — O99013 Anemia complicating pregnancy, third trimester: Secondary | ICD-10-CM

## 2021-10-25 DIAGNOSIS — R638 Other symptoms and signs concerning food and fluid intake: Secondary | ICD-10-CM

## 2021-10-25 DIAGNOSIS — O30043 Twin pregnancy, dichorionic/diamniotic, third trimester: Secondary | ICD-10-CM

## 2021-10-25 DIAGNOSIS — Z789 Other specified health status: Secondary | ICD-10-CM | POA: Insufficient documentation

## 2021-10-25 DIAGNOSIS — O99213 Obesity complicating pregnancy, third trimester: Secondary | ICD-10-CM

## 2021-10-25 DIAGNOSIS — D573 Sickle-cell trait: Secondary | ICD-10-CM | POA: Insufficient documentation

## 2021-10-25 DIAGNOSIS — O24112 Pre-existing diabetes mellitus, type 2, in pregnancy, second trimester: Secondary | ICD-10-CM

## 2021-10-25 DIAGNOSIS — Z3A31 31 weeks gestation of pregnancy: Secondary | ICD-10-CM

## 2021-10-25 DIAGNOSIS — O09213 Supervision of pregnancy with history of pre-term labor, third trimester: Secondary | ICD-10-CM

## 2021-11-01 ENCOUNTER — Ambulatory Visit: Payer: Medicaid Other | Attending: Obstetrics and Gynecology

## 2021-11-01 ENCOUNTER — Other Ambulatory Visit: Payer: Self-pay | Admitting: *Deleted

## 2021-11-01 ENCOUNTER — Ambulatory Visit: Payer: Medicaid Other | Admitting: *Deleted

## 2021-11-01 VITALS — BP 124/74 | HR 92

## 2021-11-01 DIAGNOSIS — N289 Disorder of kidney and ureter, unspecified: Secondary | ICD-10-CM | POA: Diagnosis present

## 2021-11-01 DIAGNOSIS — O30042 Twin pregnancy, dichorionic/diamniotic, second trimester: Secondary | ICD-10-CM | POA: Insufficient documentation

## 2021-11-01 DIAGNOSIS — O2303 Infections of kidney in pregnancy, third trimester: Secondary | ICD-10-CM | POA: Insufficient documentation

## 2021-11-01 DIAGNOSIS — O30043 Twin pregnancy, dichorionic/diamniotic, third trimester: Secondary | ICD-10-CM

## 2021-11-01 DIAGNOSIS — O24113 Pre-existing diabetes mellitus, type 2, in pregnancy, third trimester: Secondary | ICD-10-CM | POA: Diagnosis present

## 2021-11-01 DIAGNOSIS — O0990 Supervision of high risk pregnancy, unspecified, unspecified trimester: Secondary | ICD-10-CM | POA: Insufficient documentation

## 2021-11-01 DIAGNOSIS — O10919 Unspecified pre-existing hypertension complicating pregnancy, unspecified trimester: Secondary | ICD-10-CM | POA: Diagnosis present

## 2021-11-01 DIAGNOSIS — O10013 Pre-existing essential hypertension complicating pregnancy, third trimester: Secondary | ICD-10-CM

## 2021-11-01 DIAGNOSIS — Z789 Other specified health status: Secondary | ICD-10-CM | POA: Diagnosis present

## 2021-11-01 DIAGNOSIS — Z3A32 32 weeks gestation of pregnancy: Secondary | ICD-10-CM

## 2021-11-01 DIAGNOSIS — O99213 Obesity complicating pregnancy, third trimester: Secondary | ICD-10-CM

## 2021-11-01 DIAGNOSIS — O09893 Supervision of other high risk pregnancies, third trimester: Secondary | ICD-10-CM

## 2021-11-01 DIAGNOSIS — D573 Sickle-cell trait: Secondary | ICD-10-CM

## 2021-11-07 ENCOUNTER — Other Ambulatory Visit: Payer: Self-pay

## 2021-11-07 ENCOUNTER — Inpatient Hospital Stay (HOSPITAL_COMMUNITY)
Admission: AD | Admit: 2021-11-07 | Discharge: 2021-11-10 | DRG: 786 | Disposition: A | Discharge status: Home / Self Care | Payer: Medicaid Other | Attending: Obstetrics & Gynecology | Admitting: Obstetrics & Gynecology

## 2021-11-07 ENCOUNTER — Encounter (HOSPITAL_COMMUNITY): Payer: Self-pay | Admitting: Obstetrics & Gynecology

## 2021-11-07 ENCOUNTER — Ambulatory Visit: Payer: Medicaid Other

## 2021-11-07 ENCOUNTER — Encounter (HOSPITAL_COMMUNITY)
Admission: AD | Disposition: A | Discharge status: Home / Self Care | Payer: Self-pay | Source: Home / Self Care | Attending: Obstetrics & Gynecology

## 2021-11-07 ENCOUNTER — Inpatient Hospital Stay (HOSPITAL_COMMUNITY): Payer: Medicaid Other | Admitting: Anesthesiology

## 2021-11-07 DIAGNOSIS — O99214 Obesity complicating childbirth: Secondary | ICD-10-CM | POA: Diagnosis present

## 2021-11-07 DIAGNOSIS — D62 Acute posthemorrhagic anemia: Secondary | ICD-10-CM | POA: Diagnosis not present

## 2021-11-07 DIAGNOSIS — O30049 Twin pregnancy, dichorionic/diamniotic, unspecified trimester: Secondary | ICD-10-CM | POA: Diagnosis present

## 2021-11-07 DIAGNOSIS — O1092 Unspecified pre-existing hypertension complicating childbirth: Secondary | ICD-10-CM | POA: Diagnosis not present

## 2021-11-07 DIAGNOSIS — O42919 Preterm premature rupture of membranes, unspecified as to length of time between rupture and onset of labor, unspecified trimester: Secondary | ICD-10-CM | POA: Diagnosis present

## 2021-11-07 DIAGNOSIS — Z7984 Long term (current) use of oral hypoglycemic drugs: Secondary | ICD-10-CM | POA: Diagnosis not present

## 2021-11-07 DIAGNOSIS — D573 Sickle-cell trait: Secondary | ICD-10-CM | POA: Diagnosis present

## 2021-11-07 DIAGNOSIS — O24429 Gestational diabetes mellitus in childbirth, unspecified control: Secondary | ICD-10-CM | POA: Diagnosis not present

## 2021-11-07 DIAGNOSIS — O42913 Preterm premature rupture of membranes, unspecified as to length of time between rupture and onset of labor, third trimester: Secondary | ICD-10-CM | POA: Diagnosis present

## 2021-11-07 DIAGNOSIS — O30043 Twin pregnancy, dichorionic/diamniotic, third trimester: Secondary | ICD-10-CM | POA: Diagnosis present

## 2021-11-07 DIAGNOSIS — O1002 Pre-existing essential hypertension complicating childbirth: Secondary | ICD-10-CM | POA: Diagnosis present

## 2021-11-07 DIAGNOSIS — O321XX1 Maternal care for breech presentation, fetus 1: Secondary | ICD-10-CM

## 2021-11-07 DIAGNOSIS — Z3A33 33 weeks gestation of pregnancy: Secondary | ICD-10-CM | POA: Diagnosis not present

## 2021-11-07 DIAGNOSIS — O9081 Anemia of the puerperium: Secondary | ICD-10-CM | POA: Diagnosis not present

## 2021-11-07 DIAGNOSIS — E119 Type 2 diabetes mellitus without complications: Secondary | ICD-10-CM | POA: Diagnosis present

## 2021-11-07 DIAGNOSIS — O10919 Unspecified pre-existing hypertension complicating pregnancy, unspecified trimester: Secondary | ICD-10-CM | POA: Diagnosis present

## 2021-11-07 DIAGNOSIS — O328XX2 Maternal care for other malpresentation of fetus, fetus 2: Secondary | ICD-10-CM | POA: Diagnosis present

## 2021-11-07 DIAGNOSIS — N289 Disorder of kidney and ureter, unspecified: Secondary | ICD-10-CM

## 2021-11-07 DIAGNOSIS — Z603 Acculturation difficulty: Secondary | ICD-10-CM | POA: Diagnosis present

## 2021-11-07 DIAGNOSIS — O2303 Infections of kidney in pregnancy, third trimester: Principal | ICD-10-CM

## 2021-11-07 DIAGNOSIS — I10 Essential (primary) hypertension: Secondary | ICD-10-CM

## 2021-11-07 DIAGNOSIS — Z789 Other specified health status: Secondary | ICD-10-CM | POA: Diagnosis present

## 2021-11-07 DIAGNOSIS — O24919 Unspecified diabetes mellitus in pregnancy, unspecified trimester: Secondary | ICD-10-CM | POA: Diagnosis present

## 2021-11-07 DIAGNOSIS — O2412 Pre-existing diabetes mellitus, type 2, in childbirth: Secondary | ICD-10-CM | POA: Diagnosis present

## 2021-11-07 DIAGNOSIS — O0990 Supervision of high risk pregnancy, unspecified, unspecified trimester: Secondary | ICD-10-CM

## 2021-11-07 HISTORY — DX: Renal tubulo-interstitial disease, unspecified: N15.9

## 2021-11-07 HISTORY — DX: Gestational diabetes mellitus in pregnancy, unspecified control: O24.419

## 2021-11-07 LAB — CBC
HCT: 33 % — ABNORMAL LOW (ref 36.0–46.0)
HCT: 24.1 % — ABNORMAL LOW (ref 36.0–46.0)
Hemoglobin: 11.1 g/dL — ABNORMAL LOW (ref 12.0–15.0)
Hemoglobin: 8.2 g/dL — ABNORMAL LOW (ref 12.0–15.0)
MCH: 24.1 pg — ABNORMAL LOW (ref 26.0–34.0)
MCH: 24.4 pg — ABNORMAL LOW (ref 26.0–34.0)
MCHC: 33.6 g/dL (ref 30.0–36.0)
MCHC: 34 g/dL (ref 30.0–36.0)
MCV: 71.7 fL — ABNORMAL LOW (ref 80.0–100.0)
MCV: 71.7 fL — ABNORMAL LOW (ref 80.0–100.0)
Platelets: 356 10*3/uL (ref 150–400)
Platelets: 302 10*3/uL (ref 150–400)
RBC: 4.6 MIL/uL (ref 3.87–5.11)
RBC: 3.36 MIL/uL — ABNORMAL LOW (ref 3.87–5.11)
RDW: 15.5 % (ref 11.5–15.5)
RDW: 15.5 % (ref 11.5–15.5)
WBC: 10 10*3/uL (ref 4.0–10.5)
WBC: 14.1 10*3/uL — ABNORMAL HIGH (ref 4.0–10.5)
nRBC: 0.2 % (ref 0.0–0.2)
nRBC: 0.1 % (ref 0.0–0.2)

## 2021-11-07 LAB — POCT FERN TEST: POCT Fern Test: POSITIVE

## 2021-11-07 LAB — CREATININE, SERUM
Creatinine, Ser: 0.6 mg/dL (ref 0.44–1.00)
GFR, Estimated: >60 mL/min (ref >60)

## 2021-11-07 LAB — GLUCOSE, CAPILLARY
Glucose-Capillary: 100 mg/dL — ABNORMAL HIGH (ref 70–99)
Glucose-Capillary: 116 mg/dL — ABNORMAL HIGH (ref 70–99)
Glucose-Capillary: 128 mg/dL — ABNORMAL HIGH (ref 70–99)
Glucose-Capillary: 145 mg/dL — ABNORMAL HIGH (ref 70–99)

## 2021-11-07 LAB — SYPHILIS: RPR W/REFLEX TO RPR TITER AND TREPONEMAL ANTIBODIES, TRADITIONAL SCREENING AND DIAGNOSIS ALGORITHM: RPR Ser Ql: NONREACTIVE

## 2021-11-07 SURGERY — Surgical Case
Anesthesia: Spinal | Site: Abdomen | Wound class: Clean Contaminated

## 2021-11-07 MED ORDER — ACETAMINOPHEN 10 MG/ML IV SOLN
INTRAVENOUS | Status: DC | PRN
  Administered 2021-11-07: 1000 mg via INTRAVENOUS

## 2021-11-07 MED ORDER — COCONUT OIL OIL: 1.0000 | TOPICAL_OIL | Status: DC | PRN

## 2021-11-07 MED ORDER — ONDANSETRON HCL 4 MG/2ML IJ SOLN
INTRAMUSCULAR | Status: AC
  Filled 2021-11-07: qty 2

## 2021-11-07 MED ORDER — SOD CITRATE-CITRIC ACID 500-334 MG/5ML PO SOLN: 30.0000 mL | Freq: Once | ORAL | Status: DC

## 2021-11-07 MED ORDER — LACTATED RINGERS IV SOLN: INTRAVENOUS | Status: DC

## 2021-11-07 MED ORDER — BUPIVACAINE IN DEXTROSE 0.75-8.25 % IT SOLN
INTRATHECAL | Status: DC | PRN
  Administered 2021-11-07: 1.8 mL via INTRATHECAL

## 2021-11-07 MED ORDER — HYDROCODONE-ACETAMINOPHEN 5-325 MG PO TABS
1.0000 | ORAL_TABLET | ORAL | Status: DC | PRN
  Administered 2021-11-08 – 2021-11-09 (×4): 2 via ORAL
  Administered 2021-11-09: 1 via ORAL
  Administered 2021-11-10 (×2): 2 via ORAL
  Filled 2021-11-07 (×3): qty 2
  Filled 2021-11-07: qty 1
  Filled 2021-11-07 (×4): qty 2

## 2021-11-07 MED ORDER — IBUPROFEN 600 MG PO TABS
600.0000 mg | ORAL_TABLET | Freq: Four times a day (QID) | ORAL | Status: DC
  Administered 2021-11-08 – 2021-11-10 (×7): 600 mg via ORAL
  Filled 2021-11-07 (×7): qty 1

## 2021-11-07 MED ORDER — AMOXICILLIN 500 MG PO CAPS: 500.0000 mg | ORAL_CAPSULE | Freq: Three times a day (TID) | ORAL | Status: DC

## 2021-11-07 MED ORDER — DIPHENHYDRAMINE HCL 50 MG/ML IJ SOLN: 12.5000 mg | INTRAMUSCULAR | Status: DC | PRN

## 2021-11-07 MED ORDER — TRANEXAMIC ACID-NACL 1000-0.7 MG/100ML-% IV SOLN
INTRAVENOUS | Status: DC | PRN
  Administered 2021-11-07: 1000 mg via INTRAVENOUS

## 2021-11-07 MED ORDER — DIPHENHYDRAMINE HCL 25 MG PO CAPS
25.0000 mg | ORAL_CAPSULE | Freq: Four times a day (QID) | ORAL | Status: DC | PRN
  Administered 2021-11-07: 25 mg via ORAL
  Filled 2021-11-07: qty 1

## 2021-11-07 MED ORDER — KETOROLAC TROMETHAMINE 30 MG/ML IJ SOLN
30.0000 mg | Freq: Four times a day (QID) | INTRAMUSCULAR | Status: DC | PRN
Start: 2021-11-07 — End: 2021-11-07

## 2021-11-07 MED ORDER — NIFEDIPINE ER OSMOTIC RELEASE 30 MG PO TB24
30.0000 mg | ORAL_TABLET | Freq: Every day | ORAL | Status: DC
Start: 2021-11-07 — End: 2021-11-07

## 2021-11-07 MED ORDER — DEXAMETHASONE SODIUM PHOSPHATE 10 MG/ML IJ SOLN
INTRAMUSCULAR | Status: DC | PRN
  Administered 2021-11-07: 4 mg via INTRAVENOUS

## 2021-11-07 MED ORDER — FAMOTIDINE IN NACL 20-0.9 MG/50ML-% IV SOLN: 20.0000 mg | Freq: Once | INTRAVENOUS | Status: DC

## 2021-11-07 MED ORDER — PHENYLEPHRINE HCL-NACL 20-0.9 MG/250ML-% IV SOLN
INTRAVENOUS | Status: DC | PRN
  Administered 2021-11-07: 30 ug/min via INTRAVENOUS

## 2021-11-07 MED ORDER — FENTANYL CITRATE (PF) 100 MCG/2ML IJ SOLN
INTRAMUSCULAR | Status: AC
  Filled 2021-11-07: qty 2

## 2021-11-07 MED ORDER — DIPHENHYDRAMINE HCL 25 MG PO CAPS: 25.0000 mg | ORAL_CAPSULE | ORAL | Status: DC | PRN

## 2021-11-07 MED ORDER — SODIUM CHLORIDE 0.9 % IV SOLN
500.0000 mg | Freq: Once | INTRAVENOUS | Status: AC
  Administered 2021-11-07: 500 mg via INTRAVENOUS

## 2021-11-07 MED ORDER — OXYTOCIN-SODIUM CHLORIDE 30-0.9 UT/500ML-% IV SOLN
INTRAVENOUS | Status: AC
  Filled 2021-11-07: qty 500

## 2021-11-07 MED ORDER — PROPOFOL 10 MG/ML IV BOLUS
INTRAVENOUS | Status: AC
  Filled 2021-11-07: qty 20

## 2021-11-07 MED ORDER — ONDANSETRON HCL 4 MG/2ML IJ SOLN: 4.0000 mg | Freq: Three times a day (TID) | INTRAMUSCULAR | Status: DC | PRN

## 2021-11-07 MED ORDER — METFORMIN HCL 500 MG PO TABS: 1000.0000 mg | ORAL_TABLET | Freq: Two times a day (BID) | ORAL | Status: DC

## 2021-11-07 MED ORDER — MENTHOL 3 MG MT LOZG: 1.0000 | LOZENGE | OROMUCOSAL | Status: DC | PRN

## 2021-11-07 MED ORDER — CEFAZOLIN SODIUM-DEXTROSE 2-4 GM/100ML-% IV SOLN
2.0000 g | Freq: Once | INTRAVENOUS | Status: DC
Start: 2021-11-07 — End: 2021-11-07

## 2021-11-07 MED ORDER — METFORMIN HCL 500 MG PO TABS
1000.0000 mg | ORAL_TABLET | Freq: Every day | ORAL | Status: DC
  Administered 2021-11-07 – 2021-11-09 (×3): 1000 mg via ORAL
  Filled 2021-11-07 (×4): qty 2

## 2021-11-07 MED ORDER — DEXAMETHASONE SODIUM PHOSPHATE 4 MG/ML IJ SOLN
INTRAMUSCULAR | Status: AC
  Filled 2021-11-07: qty 1

## 2021-11-07 MED ORDER — FENTANYL CITRATE (PF) 100 MCG/2ML IJ SOLN
INTRAMUSCULAR | Status: DC | PRN
Start: 2021-11-07 — End: 2021-11-07
  Administered 2021-11-07: 15 ug via INTRATHECAL

## 2021-11-07 MED ORDER — METFORMIN HCL 500 MG PO TABS
500.0000 mg | ORAL_TABLET | Freq: Every day | ORAL | Status: DC
  Administered 2021-11-08 – 2021-11-10 (×3): 500 mg via ORAL
  Filled 2021-11-07 (×3): qty 1

## 2021-11-07 MED ORDER — DEXMEDETOMIDINE HCL IN NACL 80 MCG/20ML IV SOLN
INTRAVENOUS | Status: AC
  Filled 2021-11-07: qty 20

## 2021-11-07 MED ORDER — KETOROLAC TROMETHAMINE 30 MG/ML IJ SOLN: 30.0000 mg | Freq: Four times a day (QID) | INTRAMUSCULAR | Status: DC | PRN

## 2021-11-07 MED ORDER — DEXMEDETOMIDINE HCL IN NACL 200 MCG/50ML IV SOLN
INTRAVENOUS | Status: DC | PRN
  Administered 2021-11-07: 12 ug via INTRAVENOUS

## 2021-11-07 MED ORDER — TRANEXAMIC ACID-NACL 1000-0.7 MG/100ML-% IV SOLN
INTRAVENOUS | Status: AC
  Filled 2021-11-07: qty 200

## 2021-11-07 MED ORDER — SODIUM CHLORIDE 0.9 % IV SOLN
500.0000 mg | Freq: Once | INTRAVENOUS | Status: DC
  Filled 2021-11-07: qty 25

## 2021-11-07 MED ORDER — LACTATED RINGERS IV BOLUS
1000.0000 mL | Freq: Once | INTRAVENOUS | Status: AC
  Administered 2021-11-07: 1000 mL via INTRAVENOUS

## 2021-11-07 MED ORDER — AZITHROMYCIN 250 MG PO TABS: 1000.0000 mg | ORAL_TABLET | Freq: Once | ORAL | Status: DC

## 2021-11-07 MED ORDER — BETAMETHASONE SOD PHOS & ACET 6 (3-3) MG/ML IJ SUSP
12.0000 mg | INTRAMUSCULAR | Status: DC
  Administered 2021-11-07: 12 mg via INTRAMUSCULAR
  Filled 2021-11-07: qty 5

## 2021-11-07 MED ORDER — SCOPOLAMINE 1 MG/3DAYS TD PT72
1.0000 | MEDICATED_PATCH | Freq: Once | TRANSDERMAL | Status: AC
  Administered 2021-11-07: 1.5 mg via TRANSDERMAL
  Filled 2021-11-07: qty 1

## 2021-11-07 MED ORDER — ENOXAPARIN SODIUM 60 MG/0.6ML IJ SOSY
50.0000 mg | PREFILLED_SYRINGE | INTRAMUSCULAR | Status: DC
  Administered 2021-11-08 – 2021-11-10 (×3): 50 mg via SUBCUTANEOUS
  Filled 2021-11-07 (×3): qty 0.6

## 2021-11-07 MED ORDER — SENNOSIDES-DOCUSATE SODIUM 8.6-50 MG PO TABS
2.0000 | ORAL_TABLET | ORAL | Status: DC
  Administered 2021-11-08 – 2021-11-10 (×3): 2 via ORAL
  Filled 2021-11-07 (×3): qty 2

## 2021-11-07 MED ORDER — WITCH HAZEL-GLYCERIN EX PADS: 1.0000 | MEDICATED_PAD | CUTANEOUS | Status: DC | PRN

## 2021-11-07 MED ORDER — SODIUM CHLORIDE 0.9% FLUSH: 3.0000 mL | INTRAVENOUS | Status: DC | PRN

## 2021-11-07 MED ORDER — NIFEDIPINE ER OSMOTIC RELEASE 30 MG PO TB24
30.0000 mg | ORAL_TABLET | Freq: Every day | ORAL | Status: DC
Start: 2021-11-08 — End: 2021-11-10
  Administered 2021-11-08 – 2021-11-10 (×3): 30 mg via ORAL
  Filled 2021-11-07 (×3): qty 1

## 2021-11-07 MED ORDER — ACETAMINOPHEN 500 MG PO TABS
1000.0000 mg | ORAL_TABLET | Freq: Four times a day (QID) | ORAL | Status: AC
  Administered 2021-11-07 – 2021-11-08 (×3): 1000 mg via ORAL
  Filled 2021-11-07 (×4): qty 2

## 2021-11-07 MED ORDER — DIBUCAINE (PERIANAL) 1 % EX OINT: 1.0000 | TOPICAL_OINTMENT | CUTANEOUS | Status: DC | PRN

## 2021-11-07 MED ORDER — KETOROLAC TROMETHAMINE 30 MG/ML IJ SOLN
30.0000 mg | Freq: Four times a day (QID) | INTRAMUSCULAR | Status: AC
  Administered 2021-11-07 – 2021-11-08 (×3): 30 mg via INTRAVENOUS
  Filled 2021-11-07 (×4): qty 1

## 2021-11-07 MED ORDER — NALOXONE HCL 0.4 MG/ML IJ SOLN: 0.4000 mg | INTRAMUSCULAR | Status: DC | PRN

## 2021-11-07 MED ORDER — NALOXONE HCL 4 MG/10ML IJ SOLN: 1.0000 ug/kg/h | INTRAVENOUS | Status: DC | PRN

## 2021-11-07 MED ORDER — PRENATAL MULTIVITAMIN CH
1.0000 | ORAL_TABLET | Freq: Every day | ORAL | Status: DC
  Administered 2021-11-08 – 2021-11-09 (×2): 1 via ORAL
  Filled 2021-11-07 (×2): qty 1

## 2021-11-07 MED ORDER — KETOROLAC TROMETHAMINE 30 MG/ML IJ SOLN
30.0000 mg | Freq: Once | INTRAMUSCULAR | Status: AC | PRN
  Administered 2021-11-07: 30 mg via INTRAVENOUS

## 2021-11-07 MED ORDER — KETOROLAC TROMETHAMINE 30 MG/ML IJ SOLN
INTRAMUSCULAR | Status: AC
  Filled 2021-11-07: qty 1

## 2021-11-07 MED ORDER — MAGNESIUM SULFATE 40 GM/1000ML IV SOLN: 2.0000 g/h | INTRAVENOUS | Status: DC

## 2021-11-07 MED ORDER — SIMETHICONE 80 MG PO CHEW
80.0000 mg | CHEWABLE_TABLET | Freq: Three times a day (TID) | ORAL | Status: DC
  Administered 2021-11-07 – 2021-11-10 (×8): 80 mg via ORAL
  Filled 2021-11-07 (×8): qty 1

## 2021-11-07 MED ORDER — OXYTOCIN-SODIUM CHLORIDE 30-0.9 UT/500ML-% IV SOLN
2.5000 [IU]/h | INTRAVENOUS | Status: AC
  Administered 2021-11-07 – 2021-11-08 (×2): 2.5 [IU]/h via INTRAVENOUS
  Filled 2021-11-07 (×2): qty 500

## 2021-11-07 MED ORDER — ONDANSETRON HCL 4 MG/2ML IJ SOLN
INTRAMUSCULAR | Status: DC | PRN
  Administered 2021-11-07: 4 mg via INTRAVENOUS

## 2021-11-07 MED ORDER — SODIUM CHLORIDE 0.9 % IV SOLN
INTRAVENOUS | Status: AC
  Filled 2021-11-07: qty 5

## 2021-11-07 MED ORDER — FENTANYL CITRATE (PF) 100 MCG/2ML IJ SOLN: 25.0000 ug | INTRAMUSCULAR | Status: DC | PRN

## 2021-11-07 MED ORDER — SIMETHICONE 80 MG PO CHEW
80.0000 mg | CHEWABLE_TABLET | ORAL | Status: DC | PRN
  Administered 2021-11-08: 80 mg via ORAL
  Filled 2021-11-07: qty 1

## 2021-11-07 MED ORDER — MORPHINE SULFATE (PF) 0.5 MG/ML IJ SOLN
INTRAMUSCULAR | Status: DC | PRN
  Administered 2021-11-07: .15 mg via INTRATHECAL

## 2021-11-07 MED ORDER — MAGNESIUM SULFATE BOLUS VIA INFUSION
4.0000 g | Freq: Once | INTRAVENOUS | Status: DC
  Filled 2021-11-07: qty 1000

## 2021-11-07 MED ORDER — MORPHINE SULFATE (PF) 0.5 MG/ML IJ SOLN
INTRAMUSCULAR | Status: AC
  Filled 2021-11-07: qty 10

## 2021-11-07 MED ORDER — SODIUM CHLORIDE 0.9 % IV SOLN: 2.0000 g | Freq: Four times a day (QID) | INTRAVENOUS | Status: DC

## 2021-11-07 MED ORDER — CEFAZOLIN SODIUM-DEXTROSE 2-4 GM/100ML-% IV SOLN
2.0000 g | INTRAVENOUS | Status: AC
  Administered 2021-11-07 (×2): 2 g via INTRAVENOUS

## 2021-11-07 MED ORDER — PHENYLEPHRINE HCL-NACL 20-0.9 MG/250ML-% IV SOLN
INTRAVENOUS | Status: AC
  Filled 2021-11-07: qty 250

## 2021-11-07 MED ORDER — SODIUM CHLORIDE 0.9 % IR SOLN
Status: DC | PRN
  Administered 2021-11-07: 1000 mL

## 2021-11-07 MED ORDER — OXYTOCIN-SODIUM CHLORIDE 30-0.9 UT/500ML-% IV SOLN
INTRAVENOUS | Status: DC | PRN
  Administered 2021-11-07: 300 mL via INTRAVENOUS

## 2021-11-07 SURGICAL SUPPLY — 38 items
BENZOIN TINCTURE PRP APPL 2/3 (GAUZE/BANDAGES/DRESSINGS) ×1 IMPLANT
CHLORAPREP W/TINT 26ML (MISCELLANEOUS) ×6 IMPLANT
CLAMP CORD UMBIL (MISCELLANEOUS) ×7 IMPLANT
CLOTH BEACON ORANGE TIMEOUT ST (SAFETY) ×3 IMPLANT
DRSG OPSITE POSTOP 4X10 (GAUZE/BANDAGES/DRESSINGS) ×3 IMPLANT
ELECT REM PT RETURN 9FT ADLT (ELECTROSURGICAL) ×3
ELECTRODE REM PT RTRN 9FT ADLT (ELECTROSURGICAL) ×2 IMPLANT
EXTRACTOR VACUUM M CUP 4 TUBE (SUCTIONS) IMPLANT
GAUZE SPONGE 4X4 12PLY STRL LF (GAUZE/BANDAGES/DRESSINGS) ×2 IMPLANT
GLOVE BIOGEL PI IND STRL 7.0 (GLOVE) ×4 IMPLANT
GLOVE BIOGEL PI IND STRL 7.5 (GLOVE) ×4 IMPLANT
GLOVE BIOGEL PI INDICATOR 7.0 (GLOVE) ×2
GLOVE BIOGEL PI INDICATOR 7.5 (GLOVE) ×2
GLOVE ECLIPSE 7.5 STRL STRAW (GLOVE) ×3 IMPLANT
GOWN STRL REUS W/TWL LRG LVL3 (GOWN DISPOSABLE) ×9 IMPLANT
HEMOSTAT ARISTA ABSORB 3G PWDR (HEMOSTASIS) ×2 IMPLANT
KIT ABG SYR 3ML LUER SLIP (SYRINGE) IMPLANT
NDL HYPO 25X5/8 SAFETYGLIDE (NEEDLE) IMPLANT
NEEDLE HYPO 25X5/8 SAFETYGLIDE (NEEDLE) IMPLANT
NS IRRIG 1000ML POUR BTL (IV SOLUTION) ×3 IMPLANT
PACK C SECTION WH (CUSTOM PROCEDURE TRAY) ×3 IMPLANT
PAD ABD 8X10 STRL (GAUZE/BANDAGES/DRESSINGS) ×2 IMPLANT
PAD OB MATERNITY 4.3X12.25 (PERSONAL CARE ITEMS) ×3 IMPLANT
RTRCTR C-SECT PINK 25CM LRG (MISCELLANEOUS) ×3 IMPLANT
STRIP CLOSURE SKIN 1/2X4 (GAUZE/BANDAGES/DRESSINGS) ×1 IMPLANT
SUT MNCRL 0 VIOLET CTX 36 (SUTURE) ×4 IMPLANT
SUT MON AB 3-0 SH 27 (SUTURE) ×1
SUT MON AB 3-0 SH27 (SUTURE) ×1 IMPLANT
SUT MONOCRYL 0 CTX 36 (SUTURE) ×2
SUT VIC AB 0 CTX 36 (SUTURE) ×1
SUT VIC AB 0 CTX36XBRD ANBCTRL (SUTURE) ×2 IMPLANT
SUT VIC AB 2-0 CT1 27 (SUTURE) ×1
SUT VIC AB 2-0 CT1 TAPERPNT 27 (SUTURE) ×2 IMPLANT
SUT VIC AB 4-0 KS 27 (SUTURE) ×3 IMPLANT
TAPE CLOTH 2 (GAUZE/BANDAGES/DRESSINGS) ×2 IMPLANT
TOWEL OR 17X24 6PK STRL BLUE (TOWEL DISPOSABLE) ×3 IMPLANT
TRAY FOLEY W/BAG SLVR 14FR LF (SET/KITS/TRAYS/PACK) ×3 IMPLANT
WATER STERILE IRR 1000ML POUR (IV SOLUTION) ×3 IMPLANT

## 2021-11-07 NOTE — Op Note (Signed)
Operative Note   Patient: Virginia Pearson  Date of Procedure: 11/07/2021  Procedure: Primary Low Transverse Cesarean   Indications: malpresentation: Twin A Breech, multiple gestation: dichorionic/diamniotic, and preterm labor with preterm premature rupture of membranes  Pre-operative Diagnosis: PPROM Labor Malpresentation.   Post-operative Diagnosis:  Same, s/p primary low transverse cesarean  TOLAC Candidate: Yes   Surgeon: Surgeon(s) and Role:    * Kwinton Maahs, Mary Sella, MD - Primary    * Warner Mccreedy, MD - Fellow  Assistants: Gae Gallop, MS3  An experienced assistant was required given the standard of surgical care given the complexity of the case.  This assistant was needed for exposure, dissection, suctioning, retraction, instrument exchange, assisting with delivery with administration of fundal pressure, and for overall help during the procedure.   Anesthesia: spinal  Anesthesiologist: Elmer Picker, MD   Antibiotics: Cefazolin, Azithromycin, and second dose of Cefazolin given due to EBL >1500 cc   Estimated Blood Loss: 1747 ml   Total IV Fluids: 2300 ml  Urine Output:  100 cc OF clear urine  Specimens: Placentas to pathology   Complications:  Postpartum hemorrhage    Indications: Virginia Pearson is a 26 y.o. [phone] with an IUP [redacted]w[redacted]d presenting for unscheduled, urgent cesarean secondary to the indications listed above. Clinical course notable for presentation to [facility]U reporting SROM with clear fluid around 0800.  The risks of cesarean section discussed with the patient included but were not limited to: bleeding which may require transfusion or reoperation; infection which may require antibiotics; injury to bowel, bladder, ureters or other surrounding organs; injury to the fetus; need for additional procedures including hysterectomy in the event of a life-threatening hemorrhage; placental abnormalities with subsequent pregnancies, incisional problems,  thromboembolic phenomenon and other postoperative/anesthesia complications. The patient concurred with the proposed plan, giving informed written consent for the procedure. Patient NPO status waived given urgency of case. Anesthesia and OR aware. Preoperative prophylactic antibiotics and SCDs ordered on call to the OR.   Findings: Viable twin girls infants in complete breech presentation for twin A, and footling breech presentation for twin B, no nuchal cord present. Apgars    Virginia Pearson, Virginia Pearson [[id number]]  [address] [place] [[id number]]  [address] [[id number]]  [address] [place] [[id number]]  [address] [[id number]]     Virginia Pearson, Virginia Pearson [[id number]]   . Weight    Virginia Pearson, Virginia Pearson [[id number]]  [address] Virginia Pearson, Virginia Pearson [[id number]]  2280 g . Clear amniotic fluid. Normal placentas, three vessel cords. Normal uterus, Normal bilateral fallopian tubes, Normal bilateral ovaries.  Procedure Details: A Time Out was held and the above information confirmed. The patient received intravenous antibiotics and had sequential compression devices applied to her lower extremities preoperatively. The patient was taken back to the operative suite where spinal anesthesia was administered. At this point patient was very uncomfortable while the spinal was taking effect, and upon cervical examination was found to be 8 cm but still quite high. After induction of anesthesia, the patient was draped and prepped with a betadine splash due to her advanced cervical dilation in the usual sterile manner and placed in a dorsal supine position with a leftward tilt. At this point I also asked for TXA to be administered in addition to Ancef and Azithromycin given her high risk for hemorrhage. A low transverse skin incision was  made with scalpel and carried down through the subcutaneous tissue to the  fascia. Fascial incision was made and extended transversely. The fascia was separated from the underlying rectus tissue superiorly and inferiorly. The rectus muscles were separated in the midline bluntly and the peritoneum was entered bluntly. An Alexis retractor was placed to aid in visualization of the uterus. A bladder flap was not developed. A low transverse uterine incision was made. The twin A was successfully delivered from complete breech presentation, the umbilical cord was clamped after 1 minute. The twin B was successfully delivered from footling breech presentation, the umbilical cord was clamped after 1 minute. Cord ph was not sent, and cord blood was obtained for evaluation from both infants. The placentas were removed Intact and appeared normal. During delayed cord clamping there was brisk bleeding from the hysterotomy that I was moderately successful at controlling with ring clamps. The uterine incision was closed with running locked sutures of 0-Monocryl, and while tone was initially poor it was excellent after the hysterotomy was closed. Only a single layer of Monocryl was placed. There was some mild oozing from a serosal edge, and so Arista was applied to the hysterotomy and serosa. Overall, excellent hemostasis was noted. The abdomen and the pelvis were cleared of all clot and debris and the Virginia Pearson was removed. Hemostasis was confirmed on all surfaces.  The peritoneum was reapproximated using 2-0 vicryl . The fascia was then closed using 0 Vicryl in a running fashion. The subcutaneous layer was reapproximated with plain gut and the skin was closed with a 4-0 vicryl subcuticular stitch. The patient tolerated the procedure well. Sponge, lap, instrument and needle counts were correct x 2. She was taken to the recovery room in stable condition.   I was informed at the end of the case that EBL was still being calculated but appeared to be >1500 cc, and so I gave an additional dose of Ancef for  surgical prophylaxis. We will plan to check a CBC in the PACU.   Disposition: PACU - hemodynamically stable.    Signed: Venora Maples, MD, MPH [facility])

## 2021-11-07 NOTE — Anesthesia Procedure Notes (Signed)
Spinal  Patient location during procedure: OR Start time: 11/07/2021 10:50 AM End time: 11/07/2021 10:55 AM Reason for block: surgical anesthesia Staffing Performed: anesthesiologist  Anesthesiologist: Elmer Picker, MD Performed by: Elmer Picker, MD Authorized by: Elmer Picker, MD   Preanesthetic Checklist Completed: patient identified, IV checked, risks and benefits discussed, surgical consent, monitors and equipment checked, pre-op evaluation and timeout performed Spinal Block Patient position: sitting Prep: DuraPrep and site prepped and draped Patient monitoring: cardiac monitor, continuous pulse ox and blood pressure Approach: midline Location: L3-4 Injection technique: single-shot Needle Needle type: Pencan  Needle gauge: 24 G Needle length: 9 cm Assessment Sensory level: T6 Events: CSF return Additional Notes Functioning IV was confirmed and monitors were applied. Sterile prep and drape, including hand hygiene and sterile gloves were used. The patient was positioned and the spine was prepped. The skin was anesthetized with lidocaine.  Free flow of clear CSF was obtained prior to injecting local anesthetic into the CSF.  The spinal needle aspirated freely following injection.  The needle was carefully withdrawn.  The patient tolerated the procedure well.

## 2021-11-07 NOTE — Transfer of Care (Signed)
Immediate Anesthesia Transfer of Care Note  Patient: Virginia Pearson  Procedure(s) Performed: CESAREAN SECTION (Abdomen)  Patient Location: PACU  Anesthesia Type:Spinal  Level of Consciousness: awake  Airway & Oxygen Therapy: Patient Spontanous Breathing  Post-op Assessment: Report given to RN  Post vital signs: Reviewed and stable  Last Vitals:  Vitals Value Taken Time  BP 119/74 11/07/21 1224  Temp    Pulse 77 11/07/21 1225  Resp 18 11/07/21 1225  SpO2 100 % 11/07/21 1225  Vitals shown include unvalidated device data.  Last Pain:  Vitals:   11/07/21 1037  TempSrc:   PainSc: 8          Complications: No notable events documented.

## 2021-11-07 NOTE — Lactation Note (Signed)
This note was copied from a baby's chart. Lactation Consultation Note RN set up pump and mother is being oriented to her room. LC will f/u in the morning.  Patient Name: Virginia Pearson [id number] Date: 11/07/2021   Age:24 hours  Elder Negus 11/07/2021, 6:23 PM

## 2021-11-07 NOTE — H&P (Addendum)
Faculty Practice H&P  Virginia Pearson is a 26 y.o. female [phone] with IUP at [redacted]w[redacted]d presenting for ruptured membranes and contractions. Started at [employer]. Contractions are worsening.     Pt states she has been having contractions, no vaginal bleeding, with normal fetal movement.     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  She plans to breastfeed  Prenatal labs and studies: ABO, Rh: B/Positive/-- (02/07 1600) Antibody: Negative (02/07 1600) Rubella: 2.70 (02/07 1600) RPR: Non Reactive (05/24 1141)  HBsAg: Negative (02/07 1600)  HIV: Non Reactive (05/24 1141)  GBS:    2hr Glucola: not done Genetic screening: normal Anatomy US: normal  Past Medical History:  Past Medical History: Diagnosis Date  Chest pain   Gestational diabetes   Headache   Kidney infection   SOB (shortness of breath)   Tachycardia    Past Surgical History:  Past Surgical History: Procedure Laterality Date  NO PAST SURGERIES     Obstetrical History:  OB History    Gravida 3  Para 2  Term 1  Preterm 1  AB    Living 2    SAB    IAB    Ectopic    Multiple 0  Live Births 2         Gynecological History:  OB History    Gravida 3  Para 2  Term 1  Preterm 1  AB    Living 2    SAB    IAB    Ectopic    Multiple 0  Live Births 2        Social History:  Social History  Socioeconomic History  Marital status: Single   Spouse name: Not on file  Number of children: Not on file  Years of education: Not on file  Highest education level: Not on file Occupational History  Not on file Tobacco Use  Smoking status: Never  Smokeless tobacco: Never Vaping Use  Vaping Use: Never used Substance and Sexual Activity  Alcohol use: No  Drug use: No  Sexual activity: Yes   Birth control/protection: None Other Topics Concern  Not on file Social History Narrative  Not on file  Social Determinants of Health  Financial Resource Strain: Not on file Food Insecurity: Food Insecurity Present (06/14/2021)  Hunger Vital Sign   Worried About Running Out of Food in the Last Year: Sometimes true   Ran Out of Food in the Last Year: Sometimes true Transportation Needs: No Transportation Needs (06/14/2021)  PRAPARE - Designer, jewellery (Medical): No   Lack of Transportation (Non-Medical): No Physical Activity: Not on file Stress: Not on file Social Connections: Not on file   Family History:  Family History Problem Relation Age of Onset  Diabetes Mother   Healthy Father   Diabetes Maternal Aunt   Diabetes Maternal Uncle   Diabetes Maternal Grandmother  Medications:  Prenatal vitamins,  Current Facility-Administered Medications Medication Dose Route Frequency Provider Last Rate Last Admin  ampicillin (OMNIPEN) 2 g in sodium chloride 0.9 % 100 mL IVPB  2 g Intravenous Q6H Levie Heritage, DO      Followed by  Melene Muller ON 11/09/2021] amoxicillin (AMOXIL) capsule 500 mg  500 mg Oral TID Ade Stmarie J, DO      azithromycin [facility]) tablet 1,000 mg  1,000 mg Oral Once Dorrine Montone J, DO      betamethasone  acetate-betamethasone sodium phosphate (CELESTONE) injection 12 mg  12 mg Intramuscular Q24H Hendel Gatliff J, DO      lactated ringers infusion   Intravenous Continuous Naol Ontiveros J, DO      magnesium bolus via infusion 4 g  4 g Intravenous Once Hallie Ertl J, DO      magnesium sulfate 40 grams in SWI 1000 mL OB infusion  2 g/hr Intravenous Titrated Levie Heritage, DO       Allergies: No Known Allergies  Review of Systems: -  negatve  Physical Exam: Blood pressure 138/88, pulse (!) 108, temperature 98.7 F (37.1 C), temperature source Oral, resp. rate 20, height 5' (1.524 m), weight 110.7 kg, last menstrual period 03/22/2021, SpO2 99 %, unknown if currently breastfeeding. GENERAL: Well-developed, well-nourished female in no acute distress.  LUNGS: Clear to auscultation bilaterally.  HEART: Regular rate and rhythm. ABDOMEN: Soft, nontender, nondistended, gravid. EFW  lbs EXTREMITIES: Nontender, no edema, 2+ distal pulses. Cervical Exam: Dilatation 5cm   Effacement 80%   Station -2   Presentation: breech, cephalic. Grossly ruptured. FHT:  Baseline rate 140/170 bpm   Variability moderate  Accelerations present   Decelerations none Contractions: Every 2-3 mins   Pertinent Labs/Studies:   Lab Results Component Value Date  WBC 10.2 09/28/2021  HGB 10.1 (L) 09/28/2021  HCT 29.5 (L) 09/28/2021  MCV 74 (L) 09/28/2021  PLT 400 09/28/2021   Assessment : Virginia Pearson is a 26 y.o. G3P1102 at [redacted]w[redacted]d being admitted for cesarean section secondary to PPROM and labor  Plan: The risks of cesarean section discussed with the patient included but were not limited to: bleeding which may require transfusion or reoperation; infection which may require antibiotics; injury to bowel, bladder, ureters or other surrounding organs; injury to the fetus; need for additional procedures including hysterectomy in the event of a life-threatening hemorrhage; placental abnormalities wth subsequent  pregnancies, incisional problems, thromboembolic phenomenon and other postoperative/anesthesia complications. The patient concurred with the proposed plan, giving informed written consent for the procedure.   Patient has been NPO since last night and will remain NPO for procedure.  Preoperative prophylactic Ancef and azithromycin ordered on call to the OR.    Levie Heritage, DO 11/07/2021, 10:24 AM

## 2021-11-07 NOTE — Anesthesia Postprocedure Evaluation (Signed)
Anesthesia Post Note  Patient: Chace Bisch  Procedure(s) Performed: CESAREAN SECTION (Abdomen)     Patient location during evaluation: PACU Anesthesia Type: Spinal Level of consciousness: oriented and awake and alert Pain management: pain level controlled Vital Signs Assessment: post-procedure vital signs reviewed and stable Respiratory status: spontaneous breathing, respiratory function stable and patient connected to nasal cannula oxygen Cardiovascular status: blood pressure returned to baseline and stable Postop Assessment: no headache, no backache and no apparent nausea or vomiting Anesthetic complications: no   No notable events documented.  Last Vitals:  Vitals:   11/07/21 1425 11/07/21 1536  BP: 113/63 112/60  Pulse: 92 89  Resp: 20 19  Temp: 37.4 C 36.8 C  SpO2: 97% 98%    Last Pain:  Vitals:   11/07/21 1536  TempSrc: Oral  PainSc:    Pain Goal:                   Waunita Sandstrom L Ericka Marcellus

## 2021-11-07 NOTE — Anesthesia Preprocedure Evaluation (Addendum)
Anesthesia Evaluation  Patient identified by MRN, date of birth, ID band Patient awake    Reviewed: Allergy & Precautions, NPO status , Patient's Chart, lab work & pertinent test results  Airway Mallampati: III  TM Distance: >3 FB Neck ROM: Full    Dental no notable dental hx.    Pulmonary neg pulmonary ROS,    Pulmonary exam normal breath sounds clear to auscultation       Cardiovascular negative cardio ROS Normal cardiovascular exam Rhythm:Regular Rate:Normal     Neuro/Psych  Headaches, negative psych ROS   GI/Hepatic negative GI ROS, Neg liver ROS,   Endo/Other  diabetes, GestationalMorbid obesity (BMI 48)  Renal/GU negative Renal ROS  negative genitourinary   Musculoskeletal negative musculoskeletal ROS (+)   Abdominal   Peds  Hematology  (+) Blood dyscrasia, Sickle cell trait ,   Anesthesia Other Findings [redacted]w[redacted]d presenting in labor. Pregnancy c/b twin gestation with breech presentation baby 2. Will proceed to OR for primary C/S  Reproductive/Obstetrics (+) Pregnancy                            Anesthesia Physical Anesthesia Plan  ASA: 3 and emergent  Anesthesia Plan: Spinal   Post-op Pain Management:    Induction:   PONV Risk Score and Plan: Treatment may vary due to age or medical condition  Airway Management Planned: Natural Airway  Additional Equipment:   Intra-op Plan:   Post-operative Plan:   Informed Consent: I have reviewed the patients History and Physical, chart, labs and discussed the procedure including the risks, benefits and alternatives for the proposed anesthesia with the patient or authorized representative who has indicated his/her understanding and acceptance.     Dental advisory given  Plan Discussed with: CRNA  Anesthesia Plan Comments:        Anesthesia Quick Evaluation

## 2021-11-07 NOTE — MAU Note (Addendum)
Process explained by interpretor, pt tearful- 'scared'.   Pain increasing, 'mucho' now.  Husband put pt's glasses and phone in her bag.

## 2021-11-07 NOTE — MAU Provider Note (Addendum)
Faculty Practice H&P  Virginia Pearson is a 26 y.o. female G3P1102 with IUP at [redacted]w[redacted]d presenting for ruptured membranes and contractions. Started at 6am. Contractions are worsening.     Pt states she has been having contractions, no vaginal bleeding, with normal fetal movement.     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  She plans to breastfeed  Prenatal labs and studies: ABO, Rh: B/Positive/-- (02/07 1600) Antibody: Negative (02/07 1600) Rubella: 2.70 (02/07 1600) RPR: Non Reactive (05/24 1141)  HBsAg: Negative (02/07 1600)  HIV: Non Reactive (05/24 1141)  GBS:    2hr Glucola: not done Genetic screening: normal Anatomy US: normal  Past Medical History:  Past Medical History: Diagnosis Date  Chest pain   Gestational diabetes   Headache   Kidney infection   SOB (shortness of breath)   Tachycardia    Past Surgical History:  Past Surgical History: Procedure Laterality Date  NO PAST SURGERIES     Obstetrical History:  OB History    Gravida 3  Para 2  Term 1  Preterm 1  AB    Living 2    SAB    IAB    Ectopic    Multiple 0  Live Births 2         Gynecological History:  OB History    Gravida 3  Para 2  Term 1  Preterm 1  AB    Living 2    SAB    IAB    Ectopic    Multiple 0  Live Births 2        Social History:  Social History  Socioeconomic History  Marital status: Single   Spouse name: Not on file  Number of children: Not on file  Years of education: Not on file  Highest education level: Not on file Occupational History  Not on file Tobacco Use  Smoking status: Never  Smokeless tobacco: Never Vaping Use  Vaping Use: Never used Substance and Sexual Activity  Alcohol use: No  Drug use: No  Sexual activity: Yes   Birth control/protection: None Other Topics Concern  Not on file Social History Narrative  Not on file  Social Determinants of Health  Financial Resource Strain: Not on file Food Insecurity: Food Insecurity Present (06/14/2021)  Hunger Vital Sign   Worried About Running Out of Food in the Last Year: Sometimes true   Ran Out of Food in the Last Year: Sometimes true Transportation Needs: No Transportation Needs (06/14/2021)  PRAPARE - Transportation   Lack of Transportation (Medical): No   Lack of Transportation (Non-Medical): No Physical Activity: Not on file Stress: Not on file Social Connections: Not on file   Family History:  Family History Problem Relation Age of Onset  Diabetes Mother   Healthy Father   Diabetes Maternal Aunt   Diabetes Maternal Uncle   Diabetes Maternal Grandmother      Medications:  Prenatal vitamins,  Current Facility-Administered Medications Medication Dose Route Frequency Provider Last Rate Last Admin  ampicillin (OMNIPEN) 2 g in sodium chloride 0.9 % 100 mL IVPB  2 g Intravenous Q6H Raymone Pembroke J, DO      Followed by  [START ON 11/09/2021] amoxicillin (AMOXIL) capsule 500 mg  500 mg Oral TID Ramaj Frangos J, DO      azithromycin (ZITHROMAX) tablet 1,000 mg  1,000 mg Oral Once Brinlee Gambrell J, DO      betamethasone  acetate-betamethasone sodium phosphate (CELESTONE) injection 12 mg  12 mg Intramuscular Q24H Marlow Hendrie J, DO      lactated ringers infusion   Intravenous Continuous Brittne Kawasaki J, DO      magnesium bolus via infusion 4 g  4 g Intravenous Once Shannara Winbush J, DO      magnesium sulfate 40 grams in SWI 1000 mL OB infusion  2 g/hr Intravenous Titrated Vamsi Apfel J, DO       Allergies: No Known Allergies  Review of Systems: -  negatve  Physical Exam: Blood pressure 138/88, pulse (!) 108, temperature 98.7 F (37.1 C), temperature source Oral, resp. rate 20, height 5' (1.524 m), weight 110.7 kg, last menstrual period 03/22/2021, SpO2 99 %, unknown if currently breastfeeding. GENERAL: Well-developed, well-nourished female in no acute distress.  LUNGS: Clear to auscultation bilaterally.  HEART: Regular rate and rhythm. ABDOMEN: Soft, nontender, nondistended, gravid. EFW  lbs EXTREMITIES: Nontender, no edema, 2+ distal pulses. Cervical Exam: Dilatation 5cm   Effacement 80%   Station -2   Presentation: breech, cephalic. Grossly ruptured. FHT:  Baseline rate 140/170 bpm   Variability moderate  Accelerations present   Decelerations none Contractions: Every 2-3 mins   Pertinent Labs/Studies:   Lab Results Component Value Date  WBC 10.2 09/28/2021  HGB 10.1 (L) 09/28/2021  HCT 29.5 (L) 09/28/2021  MCV 74 (L) 09/28/2021  PLT 400 09/28/2021   Assessment : Virginia Pearson is a 26 y.o. G3P1102 at [redacted]w[redacted]d being admitted for cesarean section secondary to PPROM and labor  Plan: The risks of cesarean section discussed with the patient included but were not limited to: bleeding which may require transfusion or reoperation; infection which may require antibiotics; injury to bowel, bladder, ureters or other surrounding organs; injury to the fetus; need for additional procedures including hysterectomy in the event of a life-threatening hemorrhage; placental abnormalities wth subsequent  pregnancies, incisional problems, thromboembolic phenomenon and other postoperative/anesthesia complications. The patient concurred with the proposed plan, giving informed written consent for the procedure.   Patient has been NPO since last night and will remain NPO for procedure.  Preoperative prophylactic Ancef and azithromycin ordered on call to the OR.    Joci Dress J, DO 11/07/2021, 10:24 AM        the OR.    Levie Heritage, DO 11/07/2021, 10:24 AM

## 2021-11-07 NOTE — MAU Note (Signed)
Virginia Pearson is a 26 y.o. at [redacted]w[redacted]d here in MAU reporting: SROM clear fluid, still coming.  Pop and gush at 0800. No pain. No bleeding.  Twins- unsure of presentation Denies HA, visual changes, epigastric pain or increase in swelling.  Onset of complaint: 0800 Pain score: none Vitals:   11/07/21 0929  BP: (!) 149/100  Pulse: (!) 102  Resp: 20  Temp: 98.7 F (37.1 C)  SpO2: 99%     [id number] on Lab orders placed from triage:

## 2021-11-07 NOTE — Discharge Summary (Signed)
Postpartum Discharge Summary  Date of Service updated***     Patient Name: Virginia Pearson DOB: 1995/11/20 MRN: [id number]  Date of admission: 11/07/2021 Delivery date:   Kateri, Balch [[id number]]  11/07/2021    Markia, Kyer [[id number]]  11/07/2021  Delivering provider:    Kariann, Wecker [[id number]]  Gallitzin, MATTHEW [address] Laurie Panda Shannon [[id number]]  Clayton Lefort M  Date of discharge: 11/07/2021  Admitting diagnosis: Preterm premature rupture of membranes (PPROM) with unknown onset of labor [O42.919] Intrauterine pregnancy: [redacted]w[redacted]d    Secondary diagnosis:  Principal Problem:   Preterm premature rupture of membranes (PPROM) with unknown onset of labor Active Problems:   Supervision of high risk pregnancy, antepartum   Language barrier   Cesarean delivery delivered  Additional problems: ***None    Discharge diagnosis: Term Pregnancy Delivered                                              Post partum procedures:*** Augmentation: N/A Complications: [id number] Hospital course: Onset of Labor With Vaginal Delivery      26y.o. yo [id number] 333w2das admitted for PPROM and labor on 11/07/2021. Patient had an uncomplicated labor course as follows:  Membrane Rupture Time/Date:    CoJamilett, Ferrante0[[id number]]11:08 AM    [address][[id number]]11:08 AM ,   CoCali, Hope0[[id number]]11/07/2021    CoEmmalie, Haigh0[[id number]]11/07/2021   Delivery Method:   CoElissa Lovett0[[id number]]C-Section, Low Transverse    Corea BoLaurie Pand[place]0[[id number]]C-Section, Low Transverse  Episiotomy:    CoMikell, Camp0[[id number]]None    CoDaphane, Odekirk0[[id number]]None  Lacerations:     CoDarrel, Baroni0[[id number]]None    CoDeneene, Tarver0[[id number]]None  Patient had  an uncomplicated postpartum course.  She is ambulating, tolerating a regular diet, passing flatus, and urinating well. Patient is discharged home in stable condition on 11/07/21.  Newborn Data: Birth date:   CoDawnette, Mione0[[id number]]11/07/2021    CoTrula, Frede0[[id number]]11/07/2021  Birth time:   CoSenaya, Dicenso0[[id number]]11:09 AM    [address][[id number]]  75:91M  Gender:   CoHadleigh, Felber0[[id number]]Female    Corea BoSallyann, Kinnaird0[[id number]]Female  Living status:   CoMerly, Hinkson0[[id number]]Living    CoIdabelGi[place]in0[[id number]]Living  Apgars:   CoLoveta, Dellis0[[id number]]      SLHTD SKAJGOT, LXBWIaCumbola0[[id number]]6 [place]   CoBluma, Buresh0[[id number]]    CoFantasia, JinkinsaHensley0[[id number]]7  Weight:   CoMaurisha, Mongeau0[[id number]]2010 g    CoYanette, Tripoli0[[id number]]2280 g    Magnesium Sulfate received: No BMZ received: No Rhophylac:N/A MMR:N/A T-DaP:Given prenatally Flu: Yes Transfusion:{Transfusion received:30440034}  Physical exam  Vitals:   11/07/21 0929 11/07/21 0945  BP: (!) 149/100 138/88  Pulse: (!) 102 (!) 108  Resp: 20   Temp: 98.7 F (37.1 C)   TempSrc: Oral  SpO2: 99%   Weight: 110.7 kg   Height: 5' (1.524 m)    General: {Exam; general:21111117} Lochia: {Desc; appropriate/inappropriate:30686::"appropriate"} Uterine Fundus: {Desc; firm/soft:30687} Incision: {Exam; incision:21111123} DVT Evaluation: {Exam; WUJ:8119147} Labs: Lab Results  Component Value Date   WBC 10.0 11/07/2021   HGB 11.1 (L) 11/07/2021   HCT 33.0 (L) 11/07/2021   MCV 71.7 (L) 11/07/2021   PLT 356 11/07/2021      Latest Ref Rng & Units 06/09/2021    8:40 PM  CMP  Glucose 70 - 99 mg/dL 100   BUN 6 - 20 mg/dL 8   Creatinine 0.44 - 1.00 mg/dL 0.53   Sodium 135 -  145 mmol/L 134   Potassium 3.5 - 5.1 mmol/L 3.7   Chloride 98 - 111 mmol/L 101   CO2 22 - 32 mmol/L 23   Calcium 8.9 - 10.3 mg/dL 9.1   Total Protein 6.5 - 8.1 g/dL 6.7   Total Bilirubin 0.3 - 1.2 mg/dL 0.3   Alkaline Phos 38 - 126 U/L 79   AST 15 - 41 U/L 14   ALT 0 - 44 U/L 17    Edinburgh Score:    02/26/2019    5:07 PM  Edinburgh Postnatal Depression Scale Screening Tool  I have been able to laugh and see the funny side of things. 0  I have looked forward with enjoyment to things. 0  I have blamed myself unnecessarily when things went wrong. 0  I have been anxious or worried for no good reason. 0  I have felt scared or panicky for no good reason. 0  Things have been getting on top of me. 0  I have been so unhappy that I have had difficulty sleeping. 0  I have felt sad or miserable. 0  I have been so unhappy that I have been crying. 0  The thought of harming myself has occurred to me. 0  Edinburgh Postnatal Depression Scale Total 0     After visit meds:  Allergies as of 11/07/2021   No Known Allergies   Med Rec must be completed prior to using this [facility]***        Discharge home in stable condition Infant Feeding: {Baby feeding:23562} Infant Disposition:{CHL IP OB HOME WITH WGNFAO:13086} Discharge instruction: per After Visit Summary and Postpartum booklet. Activity: Advance as tolerated. Pelvic rest for 6 weeks.  Diet: {OB VHQI:69629528} Future Appointments: Future Appointments  Date Time Provider [place]  11/07/2021  1:45 PM [facility] NURSE [facility]  11/07/2021  2:00 PM [facility] US1 [facility]  11/15/2021  1:45 PM [facility] NURSE [facility]  11/15/2021  2:00 PM [facility] US1 [facility]  11/22/2021  1:45 PM [facility] NURSE [facility]  11/22/2021  2:00 PM [facility] US1 [facility]  11/29/2021  2:30 PM [facility] NURSE [facility]  11/29/2021  2:45 PM [facility] US6 [facility]  12/06/2021  4:15 PM Eckstat, Annice Needy, MD [facility]   Follow up  Visit: Message sent to [facility] by Dr. Cy Blamer on 7/3  Please schedule this patient for a In person postpartum visit in 6 weeks with the following provider:  Dr. Dione Plover . Additional Postpartum F/U:Incision check 1 week  High risk pregnancy complicated by:  di/di twins and PPROM Delivery mode:     Shernell, Saldierna [[id number]]  C-Section, Low Transverse    Genee, Rann [[id number]]  C-Section, Low Transverse  Anticipated Birth Control:   considering depo   11/07/2021 Renard Matter, MD

## 2021-11-08 DIAGNOSIS — D573 Sickle-cell trait: Secondary | ICD-10-CM

## 2021-11-08 DIAGNOSIS — O2303 Infections of kidney in pregnancy, third trimester: Secondary | ICD-10-CM

## 2021-11-08 LAB — PREPARE RBC (CROSSMATCH)

## 2021-11-08 LAB — CBC
HCT: 20.2 % — ABNORMAL LOW (ref 36.0–46.0)
Hemoglobin: 6.8 g/dL — CL (ref 12.0–15.0)
MCH: 24.2 pg — ABNORMAL LOW (ref 26.0–34.0)
MCHC: 33.7 g/dL (ref 30.0–36.0)
MCV: 71.9 fL — ABNORMAL LOW (ref 80.0–100.0)
Platelets: 260 10*3/uL (ref 150–400)
RBC: 2.81 MIL/uL — ABNORMAL LOW (ref 3.87–5.11)
RDW: 15.4 % (ref 11.5–15.5)
WBC: 14.3 10*3/uL — ABNORMAL HIGH (ref 4.0–10.5)
nRBC: 0.1 % (ref 0.0–0.2)

## 2021-11-08 LAB — GLUCOSE, CAPILLARY
Glucose-Capillary: 113 mg/dL — ABNORMAL HIGH (ref 70–99)
Glucose-Capillary: 114 mg/dL — ABNORMAL HIGH (ref 70–99)
Glucose-Capillary: 155 mg/dL — ABNORMAL HIGH (ref 70–99)
Glucose-Capillary: 119 mg/dL — ABNORMAL HIGH (ref 70–99)

## 2021-11-08 MED ORDER — DIPHENHYDRAMINE HCL 25 MG PO CAPS
25.0000 mg | ORAL_CAPSULE | Freq: Once | ORAL | Status: AC
  Administered 2021-11-08: 25 mg via ORAL
  Filled 2021-11-08: qty 1

## 2021-11-08 MED ORDER — SODIUM CHLORIDE 0.9 % IV SOLN
500.0000 mg | Freq: Once | INTRAVENOUS | Status: DC
  Administered 2021-11-10: 500 mg via INTRAVENOUS
  Filled 2021-11-08: qty 25

## 2021-11-08 MED ORDER — HYDROMORPHONE HCL 1 MG/ML IJ SOLN
1.0000 mg | INTRAMUSCULAR | Status: DC | PRN
  Administered 2021-11-08: 2 mg via INTRAVENOUS
  Filled 2021-11-08: qty 2

## 2021-11-08 MED ORDER — ACETAMINOPHEN 325 MG PO TABS: 650.0000 mg | ORAL_TABLET | Freq: Once | ORAL | Status: DC

## 2021-11-08 MED ORDER — SODIUM CHLORIDE 0.9% IV SOLUTION: Freq: Once | INTRAVENOUS | Status: DC

## 2021-11-08 NOTE — Progress Notes (Signed)
CSW attempted to meet with MOB to complete psychosocial assessment; however, RN reported that MOB is currently in a lot of pain. CSW will try again at a later time.   Celso Sickle, LCSW Clinical Social Worker [facility] Cell#: [phone]

## 2021-11-08 NOTE — Progress Notes (Signed)
POSTPARTUM PROGRESS NOTE  POD #1  Subjective:  Lakeesha Fontanilla is a 26 y.o. [phone] s/p primary LTCS at [redacted]w[redacted]d.  She reports she doing well. No acute events overnight. She denies any problems with ambulating, voiding or po intake. Denies nausea or vomiting. She has  passed flatus. Pain is well controlled.  Lochia is appropriate.  Pt states her legs still feel a little heavy, but otherwise she feels fine.  Objective: Blood pressure 112/61, pulse 69, temperature 97.8 F (36.6 C), temperature source Oral, resp. rate 16, height 5' (1.524 m), weight 110.7 kg, last menstrual period 03/22/2021, SpO2 100 %, unknown if currently breastfeeding.  Physical Exam:  General: alert, cooperative and no distress Chest: no respiratory distress, CTAB Heart:regular rate and rhythm, distal pulses intact Abdomen: soft, nontender, nondistended , obese, positive bowel sounds Uterine Fundus: firm, appropriately tender DVT Evaluation: No calf swelling or tenderness Extremities: +1 edema Skin: warm, dry; incision clean/dry/intact w/ pressure and honeycomb dressing in place  Recent Labs    11/07/21 1222 11/08/21 0610  HGB 8.2* 6.8*  HCT 24.1* 20.2*    Assessment/Plan: Genavieve Mangiapane is a 26 y.o. [phone] s/p primary cesarean section at [redacted]w[redacted]d for PROM and fetal malpresentation of di/di twins.  POD#1 -  D/C foley this AM Moderate to severe anemia noted 2/2 postpartum hemorrhage, will transfuse 2 units PRBC   LOS: 1 day   Mariel Aloe, Md Faculty Attending, [facility] [employer] 11/08/2021, 7:16 AM

## 2021-11-08 NOTE — Lactation Note (Signed)
This note was copied from a baby's chart.  NICU Lactation Consultation Note  Patient Name: Mailen Newborn Today's Date: 11/08/2021 Age:24 hours   Subjective Reason for consult: Initial assessment Mother is pumping frequently for infants. She bf her other children until she returned to work and denies hx breast surgery/trauma.   LC provided ed. RN to place referral for stork pump.   Objective Infant data: Mother's Current Feeding Choice: Breast Milk    Maternal data: [id number]  C-Section, Low Transverse Significant Breast History:: + breast development  Does the patient have breastfeeding experience prior to this delivery?: Yes How long did the patient breastfeed?: "a few months"  Pumping frequency: q3h Pumped volume: 10 mL  Assessment Infant: Feeding Status: NPO  Maternal: Milk volume: Normal   Intervention/Plan Interventions: Education; [employer]; Infant Driven Feeding Algorithm education  Tools: Pump Pump Education: Setup, frequency, and cleaning; Milk Storage  Plan: Consult Status: NICU follow-up  NICU Follow-up type: New admission follow up; Verify absence of engorgement; Maternal D/C visit; Weekly NICU follow up; Verify onset of copious milk  Mother to continue pumping q3h. To bring any EBM to NICU. LC to f/u on stork pump referral.  Elder Negus 11/08/2021, 10:35 AM

## 2021-11-09 ENCOUNTER — Encounter (HOSPITAL_COMMUNITY): Payer: Self-pay | Admitting: Family Medicine

## 2021-11-09 LAB — CBC
HCT: 25.9 % — ABNORMAL LOW (ref 36.0–46.0)
Hemoglobin: 9.1 g/dL — ABNORMAL LOW (ref 12.0–15.0)
MCH: 26.3 pg (ref 26.0–34.0)
MCHC: 35.1 g/dL (ref 30.0–36.0)
MCV: 74.9 fL — ABNORMAL LOW (ref 80.0–100.0)
Platelets: 251 10*3/uL (ref 150–400)
RBC: 3.46 MIL/uL — ABNORMAL LOW (ref 3.87–5.11)
RDW: 17.4 % — ABNORMAL HIGH (ref 11.5–15.5)
WBC: 14.3 10*3/uL — ABNORMAL HIGH (ref 4.0–10.5)
nRBC: 0.6 % — ABNORMAL HIGH (ref 0.0–0.2)

## 2021-11-09 LAB — BPAM RBC
Blood Product Expiration Date: 202307262359
Blood Product Expiration Date: 202307272359
ISSUE DATE / TIME: 202307040815
ISSUE DATE / TIME: 202307041159
Unit Type and Rh: 7300
Unit Type and Rh: 7300

## 2021-11-09 LAB — CULTURE, BETA STREP (GROUP B ONLY)

## 2021-11-09 LAB — TYPE AND SCREEN
ABO/RH(D): B POS
Antibody Screen: NEGATIVE
Unit division: 0
Unit division: 0

## 2021-11-09 LAB — GLUCOSE, CAPILLARY
Glucose-Capillary: 124 mg/dL — ABNORMAL HIGH (ref 70–99)
Glucose-Capillary: 82 mg/dL (ref 70–99)
Glucose-Capillary: 90 mg/dL (ref 70–99)
Glucose-Capillary: 94 mg/dL (ref 70–99)

## 2021-11-09 LAB — GC/CHLAMYDIA PROBE AMP (~~LOC~~) NOT AT ARMC
Chlamydia: NEGATIVE
Comment: NEGATIVE
Comment: NORMAL
Neisseria Gonorrhea: NEGATIVE

## 2021-11-09 NOTE — Lactation Note (Signed)
This note was copied from a baby's chart.  NICU Lactation Consultation Note  Patient Name: Virginia Pearson [id number] Date: 11/09/2021 Age:24 hours  Subjective Reason for consult: Follow-up assessment; Infant < 6lbs; NICU baby; Preterm <34wks; Multiple gestation  Visited with mom of 22 hours old pre-term NICU twins, she reports she's been pumping but not consistently. Explained to Virginia Pearson the importance of consistent pumping for the onset of lactogenesis II, she voiced understanding. She doesn't have a pump for home use, WIC referral was faxed to the [facility] office. Virginia Pearson plans to eventually take babies to breast once they're ready.  Objective Infant data: Mother's Current Feeding Choice: Breast Milk and Donor Milk  Maternal data: [id number]  C-Section, Low Transverse Pumping frequency: 2 times/24 hours Pumped volume: 5 mL Risk factor for low milk supply:: prematurity, infrequent pumping WIC Program: Yes WIC Referral Sent?: Yes  Assessment Infant: In NICU Feeding Status: NPO  Maternal: Milk volume: Normal  Intervention/Plan Interventions: Breast feeding basics reviewed; DEBP; Education; [employer] brochure; "The NICU and Your Baby" book Tools: Pump Pump Education: Setup, frequency, and cleaning; Milk Storage  Plan of care: Encouraged mom to try pumping every 3 hours, at least 8 pumping sessions/24 hours She'll continue turning in her breastmilk to the NICU  Several female visitors present at this time. All questions and concerns answered, family to contact [facility] services PRN.  Consult Status: NICU follow-up NICU Follow-up type: Maternal D/C visit; Verify onset of copious milk; Verify absence of engorgement   Samil Mecham S Ajane Novella 11/09/2021, 2:14 PM

## 2021-11-09 NOTE — Clinical Social Work Maternal (Signed)
CLINICAL SOCIAL WORK MATERNAL/CHILD NOTE  Patient Details  Name: Virginia Pearson MRN: [id number] Date of Birth: 04/29/1996  Date:  11/09/2021  Clinical Social Worker Initiating Note:  Karesa Maultsby, LCSW Date/Time: Initiated:  11/09/21/1150     Child's Name:  Girl A: Virginia Pearson  Girl B: Virginia Pearson   Biological Parents:  Mother, Father (Father: Virginia Pearson)   Need for Interpreter:  Spanish   Reason for Referral:  Parental Support of Premature Babies < 32 weeks/or Critically Ill babies   Address:  [address] [place] [address]    Phone number:  [phone] (home)     Additional phone number:   Household Members/Support Persons (HM/SP):   Household Member/Support Person 1, Household Member/Support Person 2, Household Member/Support Person 3   HM/SP Name Relationship DOB or Age  HM/SP -1 Virginia Pearson FOB    HM/SP -2 Virginia Pearson daughter 11/26/15  HM/SP -3 Kelvin Pearson son 02/26/19  HM/SP -4        HM/SP -5        HM/SP -6        HM/SP -7        HM/SP -8          Natural Supports (not living in the home):  Immediate Family   Professional Supports: None   Employment: Unemployed   Type of Work:     Education:  High school graduate   Homebound arranged:    Financial Resources:  Medicaid   Other Resources:  WIC   Cultural/Religious Considerations Which May Impact Care:    Strengths:  Ability to meet basic needs  , Understanding of illness, Pediatrician chosen   Psychotropic Medications:         Pediatrician:    [place] area  Pediatrician List:   [facility]  [place]    [place]    [place]    [place]    [place]      Pediatrician Fax Number:    Risk Factors/Current Problems:  Childcare   Cognitive State:  Able to Concentrate  , Alert  , Linear Thinking  , Insightful  , Goal Oriented     Mood/Affect:  Calm  , Interested  , Comfortable      CSW Assessment: CSW met with MOB at bedside to complete psychosocial assessment, MOB accompanied by female guest. CSW utilized [employer] spanish video interpreter (Guadalupe #[id number]). CSW introduced self and explained role. MOB granted CSW verbal permission to speak in front of guest about anything. MOB was polite and remained engaged during assessment. MOB reported that she resides with FOB and two older children. MOB reported that she receives WIC and inquired about how to apply for food stamps. CSW informed MOB that she can apply online or in person. MOB reported that she was unsuccessful applying online but is aware of where the local DHHS office is to apply in person. CSW inquired about food insecurity, MOB reported that sometimes she has issues getting food but not currently. CSW asked if information about local food pantries would be helpful, MOB reported yes. CSW provided MOB with local food pantry resource. CSW inquired about any other resources that may be helpful for MOB, MOB reported that childcare resources would be helpful. CSW provided MOB with contact information for the Childrens and Families First program. CSW inquired supplies for infants, MOB reported that she has two mini cribs for infants. MOB shared that   she has not started shopping yet. CSW informed MOB about [employer]'s [employer] if any assistance is needed obtaining items for infants. MOB reported that assistance with all essential items would be helpful, CSW agreed to make referral. MOB agreeable to referral. MOB reported that she has an appointment to receive two free car seats on 11/14/21 at 1:30pm, CSW encouraged MOB to keep that appointment as the car seats at the hospital are $30. MOB provided this number as a contact for the car seats ([phone]). CSW contacted the number provided to follow up for MOB regarding car seats, no answer. CSW will try to call again at a later time. CSW  inquired about MOB's support system, MOB reported that her mother in law is a support.   Interpreter disconnected. CSW attempted to get another interpreter, MOB declined a new interpreter and requested that her guest (Carla) interpret for the remainder of the assessment.   CSW inquired about MOB's mental health history. MOB denied any mental health history. MOB denied any history of postpartum depression. CSW inquired about how MOB was feeling emotionally since giving birth, MOB reported that she is feeling traumatized. MOB shared that her birthing experience was traumatizing due to having an emergency c-section, noting the infants coming early was a surprise. CSW acknowledged, normalized, validated MOB's feelings. CSW discussed difficulties/emotions associated with having a traumatic birthing experience. MOB presented calm and did not demonstrate any acute mental health signs/symptoms. CSW assessed for safety, MOB denied SI, HI, and domestic violence.   CSW provided education regarding the baby blues period vs. perinatal mood disorders, discussed treatment and gave resources for mental health follow up if concerns arise.  CSW recommends self-evaluation during the postpartum time period using the New Mom Checklist from Postpartum Progress and encouraged MOB to contact a medical professional if symptoms are noted at any time.    CSW provided review of Sudden Infant Death Syndrome (SIDS) precautions.    CSW and MOB discussed infants NICU admission. CSW informed MOB about the NICU, what to expect, and resources/supports available while infant is admitted to the NICU. MOB reported that she feels well informed about infants care. MOB denied any transportation barriers with visiting infants in the NICU. MOB denied any questions/concerns regarding the NICU.   CSW will continue to offer resources/supports while infant is admitted to the NICU as MOB opted for CSW to check in weekly.  CSW completed FSN referral  for requested items.  CSW Plan/Description:  Sudden Infant Death Syndrome (SIDS) Education, Perinatal Mood and Anxiety Disorder (PMADs) Education, Psychosocial Support and Ongoing Assessment of Needs, Other Information/Referral to Community Resources, Other Patient/Family Education    Milissa Fesperman L Rushie Brazel, LCSW 11/09/2021, 11:53 AM 

## 2021-11-09 NOTE — Progress Notes (Signed)
POSTPARTUM PROGRESS NOTE  POD #2  Subjective:  Virginia Pearson is a 26 y.o. [id number] s/p pLTCS at [redacted]w[redacted]d. Today she notes no acute complaints. She denies any problems with ambulating, voiding or po intake. Denies nausea or vomiting. She has passed flatus, no BM.  Pain is well controlled, notes some discomfort on her left side.  Lochia appropriate Denies fever/chills/chest pain/SOB.  no HA, no blurry vision, no RUQ pain  Objective: Blood pressure 124/69, pulse 73, temperature 98.3 F (36.8 C), temperature source Oral, resp. rate 16, height 5' (1.524 m), weight 110.7 kg, last menstrual period 03/22/2021, SpO2 98 %, unknown if currently breastfeeding.  Physical Exam:  General: alert, cooperative and no distress Chest: no respiratory distress Heart: regular rate and rhythm Abdomen: soft, nontender, +BS Uterine Fundus: firm, appropriately tender Incision: pressure dressing in placed- clean and dry DVT Evaluation: No calf swelling or tenderness Extremities: no edema Skin: warm, dry  Results for orders placed or performed during the hospital encounter of 11/07/21 (from the past 24 hour(s))  Glucose, capillary     Status: Abnormal   Collection Time: 11/08/21 11:36 AM  Result Value Ref Range   Glucose-Capillary 114 (H) 70 - 99 mg/dL  Glucose, capillary     Status: Abnormal   Collection Time: 11/08/21  7:12 PM  Result Value Ref Range   Glucose-Capillary 155 (H) 70 - 99 mg/dL  Glucose, capillary     Status: Abnormal   Collection Time: 11/08/21 10:02 PM  Result Value Ref Range   Glucose-Capillary 119 (H) 70 - 99 mg/dL    Assessment/Plan: Emileigh Kellett is a 26 y.o. [phone] s/p pLTCS at [redacted]w[redacted]d POD#2 -Anemia Hgb 6.8, s/p 2upRBC, repeat CBC pending this am VSS, currently asymptomatic  -cHTN- continue procardia 30XL daily -T2DM- continue metformin, accuchecks as above  -pain controlled -encourage ambulation, ok to shower today -meeting postop milestones  appropriately  Contraception: possibly Dep[place] Feeding: breast  Dispo: Continue routine postop care, plan for discharge home tomorrow   LOS: 2 days   Myna Hidalgo, DO Faculty Attending, Center for [facility] 11/09/2021, 7:34 AM

## 2021-11-10 ENCOUNTER — Other Ambulatory Visit (HOSPITAL_COMMUNITY): Payer: Self-pay

## 2021-11-10 DIAGNOSIS — D62 Acute posthemorrhagic anemia: Secondary | ICD-10-CM | POA: Diagnosis not present

## 2021-11-10 LAB — SURGICAL PATHOLOGY

## 2021-11-10 LAB — GLUCOSE, CAPILLARY: Glucose-Capillary: 86 mg/dL (ref 70–99)

## 2021-11-10 MED ORDER — FUROSEMIDE 20 MG PO TABS
20.0000 mg | ORAL_TABLET | Freq: Every day | ORAL | 0 refills | Status: DC
  Filled 2021-11-10: qty 5, 5d supply, fill #0

## 2021-11-10 MED ORDER — ACETAMINOPHEN 500 MG PO TABS
1000.0000 mg | ORAL_TABLET | Freq: Four times a day (QID) | ORAL | 0 refills | Status: DC | PRN
  Filled 2021-11-10: qty 90, 12d supply, fill #0

## 2021-11-10 MED ORDER — OXYCODONE HCL 5 MG PO TABS
5.0000 mg | ORAL_TABLET | Freq: Four times a day (QID) | ORAL | 0 refills | Status: DC | PRN
  Filled 2021-11-10: qty 30, 8d supply, fill #0

## 2021-11-10 MED ORDER — NIFEDIPINE ER 30 MG PO TB24
30.0000 mg | ORAL_TABLET | Freq: Every day | ORAL | 3 refills | Status: DC
  Filled 2021-11-10: qty 30, 30d supply, fill #0

## 2021-11-10 MED ORDER — IBUPROFEN 600 MG PO TABS
600.0000 mg | ORAL_TABLET | Freq: Four times a day (QID) | ORAL | 2 refills | Status: DC | PRN
  Filled 2021-11-10: qty 60, 15d supply, fill #0

## 2021-11-10 MED ORDER — FERROUS GLUCONATE 324 (38 FE) MG PO TABS
324.0000 mg | ORAL_TABLET | Freq: Every day | ORAL | 3 refills | Status: DC
  Filled 2021-11-10: qty 30, 30d supply, fill #0

## 2021-11-10 MED ORDER — FERROUS GLUCONATE 324 (38 FE) MG PO TABS: 324.0000 mg | ORAL_TABLET | Freq: Every day | ORAL | Status: DC

## 2021-11-10 MED ORDER — SENNOSIDES-DOCUSATE SODIUM 8.6-50 MG PO TABS
2.0000 | ORAL_TABLET | Freq: Every evening | ORAL | 2 refills | Status: DC | PRN
  Filled 2021-11-10: qty 30, 15d supply, fill #0

## 2021-11-10 NOTE — Plan of Care (Signed)
Pt to be discharged home with printed instructions. Doral Digangi L Vaneza Pickart, RN  

## 2021-11-10 NOTE — Progress Notes (Signed)
MOB contacted CSW and provided update that they no longer need assistance with car seats. MOB requested that CSW provide update to the program that was originally supplying the car seats. MOB denied any needs at this time.   CSW contacted ([phone]) and spoke with (Leigha Jordan) and provided update that MOB no longer needed car seats.   Tyreek Clabo, LCSW Clinical Social Worker [facility] Cell#: [phone] 

## 2021-11-12 ENCOUNTER — Ambulatory Visit: Payer: Self-pay

## 2021-11-12 NOTE — Lactation Note (Addendum)
This note was copied from a baby's chart. Lactation Consultation Note  Patient Name: Virginia Pearson Today's Date: 11/12/2021   Age:24 days  LC in the room to visit with mom for 5 days post-partum check up but she hasn't come to the hospital to visit with twin babies. Spoke to [employer] pump representative and they finally approved her stork pump and delivered it babies room this afternoon. Called Ms. Corea-[name] and let her know that her pump has arrived but only got voicemail, left a voice message for her in Spanish, family is Spanish speaker. NICU LC to follow up with mom the next time she comes to the unit.    Chyler Creely S Christpoher Sievers 11/12/2021, 6:15 PM

## 2021-11-14 ENCOUNTER — Ambulatory Visit (INDEPENDENT_AMBULATORY_CARE_PROVIDER_SITE_OTHER): Payer: Medicaid Other | Admitting: General Practice

## 2021-11-14 ENCOUNTER — Other Ambulatory Visit: Payer: Self-pay

## 2021-11-14 VITALS — BP 132/64 | HR 105 | Ht 60.0 in | Wt 223.0 lb

## 2021-11-14 DIAGNOSIS — Z5189 Encounter for other specified aftercare: Secondary | ICD-10-CM

## 2021-11-14 DIAGNOSIS — Z0130 Encounter for examination of blood pressure without abnormal findings: Secondary | ICD-10-CM

## 2021-11-14 NOTE — Progress Notes (Signed)
Patient presents to office today for wound & BP check following primary c-section on 7/3. She reports some dizziness and constipation since discharge. She is taking iron & stool softeners as prescribed. Recommended she try OTC miralax for constipation as well as high fiber intake & adequate hydration. Honeycomb dressing was still in place over incision. Dressing removed. Incision is clean, dry & intact- appears to be healing well. Wound care and signs & symptoms of infection reviewed with patient. Patient will follow up with [place] on 8/2 for pp visit.   Chase Caller RN BSN 11/14/21

## 2021-11-15 ENCOUNTER — Ambulatory Visit: Payer: Medicaid Other

## 2021-11-15 ENCOUNTER — Ambulatory Visit: Payer: Self-pay

## 2021-11-15 NOTE — Lactation Note (Signed)
This note was copied from a baby's chart.  NICU Lactation Consultation Note  Patient Name: Annmargaret Decaprio [id number] Date: 11/15/2021 Age:24 days  Subjective Reason for consult: Follow-up assessment; NICU baby; Late-preterm 34-36.6wks; Infant < 6lbs; Multiple gestation  Visited with mom of 30 55/78 weeks old (adjusted) NICU twins, she's a P3 and reports the full onset of lactogenesis II. Noticed that she hasn't been pumping consistently, she goes + 6 hours without pumping at night and sometimes during the day too. Explained the importance of consistent pumping for the prevention of engorgement and to protect her supply; she voiced understanding. She denies any S/S of engorgement at this time, although she mentioned that her breasts "hurt" when she goes so many hours without pumping but the pain goes away after the pumps. Ms. Evern Core has some questions regarding breast asymmetry, supply, sibling visitation and payment of one of her bills that the insurance didn't cover, she hasn't been set up with a MyChart account yet. Reviewed pumping schedule, lactogenesis III, feeding cues and IDF 1/2.  Objective Infant data: Mother's Current Feeding Choice: Breast Milk  Infant feeding assessment Scale for Readiness: 3  Maternal data: [id number]  C-Section, Low Transverse Current breast feeding challenges:: NICU admission How long did the patient breastfeed?: 7 months Pumping frequency: 3-4 times/24 hours Pumped volume: 120 mL Risk factor for low milk supply:: prematurity, infant separation WIC Program: Yes WIC Referral Sent?: Yes Pump: Stork Pump  Assessment Infant: In NICU  Maternal: Milk volume: Low  Intervention/Plan Interventions: Breast feeding basics reviewed; DEBP; Education; [employer] brochure Tools: Pump Pump Education: Setup, frequency, and cleaning; Milk Storage  Plan of care: Encouraged mom to try pumping every 3 hours, as close as she could get to 8 pumping  sessions/24 hours She'll call for latch assistance once babies are ready   FOB present. All questions and concerns answered, family to contact [employer] services PRN.  Consult Status: NICU follow-up NICU Follow-up type: Weekly NICU follow up; Assist with IDF-1 (Mother to pre-pump before breastfeeding)    Ahava Kissoon Venetia Constable 11/15/2021, 6:49 PM

## 2021-11-22 ENCOUNTER — Ambulatory Visit: Payer: Medicaid Other

## 2021-11-23 ENCOUNTER — Ambulatory Visit: Payer: Self-pay

## 2021-11-23 NOTE — Lactation Note (Signed)
This note was copied from a baby's chart.  NICU Lactation Consultation Note  Patient Name: Virginia Pearson [id number] Date: 11/23/2021 Age:26 y.o.   Subjective Reason for consult: Follow-up assessment Mother continues to pump often. She complains of "burning" while pumping but at no other times. She did not have her pump kit at hospital but recalls that she is pumping with 53m flanges. I provided 226mand suggested she try slightly larger size and assess comfort.   Objective Infant data: Mother's Current Feeding Choice: Breast Milk  Infant feeding assessment Scale for Readiness: 3    Maternal data: [id number]-Section, Low Transverse  Pumping frequency: q3h Pumped volume: 150 mL   WIC Program: Yes WIC Referral Sent?: Yes  Assessment Maternal: Milk volume: Normal Burning sensation may be related to flange size. A slightly larger flange may cause less friction and pain.  Intervention/Plan Interventions: Education  No data recorded Plan: Consult Status: NICU follow-up  NICU Follow-up type: Weekly NICU follow up  LC to f/u to observe a pumping (if mother brings pump kit) and re-check nipple pain.  LiGwynne Edinger/19/2023, 6:21 PM

## 2021-11-29 ENCOUNTER — Ambulatory Visit: Payer: Medicaid Other

## 2021-11-30 ENCOUNTER — Ambulatory Visit: Payer: Self-pay

## 2021-11-30 NOTE — Lactation Note (Signed)
This note was copied from a baby's chart.  NICU Lactation Consultation Note  Patient Name: Deysha Cartier [id number] Date: 11/30/2021 Age:24 wk.o.   Subjective Reason for consult: Follow-up assessment Mother continues to pump and breastfeed without difficulty.   Objective Infant data: Mother's Current Feeding Choice: Breast Milk  Infant feeding assessment Scale for Readiness: 2 Scale for Quality: 2    Maternal data: [id number]  C-Section, Low Transverse  Pumping frequency: q3h during the day / 6h at night Pumped volume: 150 mL   WIC Program: Yes WIC Referral Sent?: Yes Pump: Stork Pump  Assessment Infant: LATCH Score: 8   Maternal: Milk volume: Normal   Intervention/Plan Interventions: Education  Plan: Consult Status: NICU follow-up  NICU Follow-up type: Weekly NICU follow up; Assist with IDF-2 (Mother does not need to pre-pump before breastfeeding)    Elder Negus 11/30/2021, 3:28 PM

## 2021-12-04 ENCOUNTER — Ambulatory Visit: Payer: Self-pay

## 2021-12-04 NOTE — Lactation Note (Signed)
This note was copied from a baby's chart. Lactation Consultation Note Provided replacement 21mm shield at RN's request. No charge.  Patient Name: Virginia Pearson Date: 12/04/2021   Age:24 wk.o.   Feeding Nipple Type: Dr. Levert Feinstein Preemie   Elder Negus 12/04/2021, 6:26 PM

## 2021-12-06 ENCOUNTER — Encounter: Payer: Medicaid Other | Admitting: Family Medicine

## 2021-12-06 NOTE — Progress Notes (Unsigned)
Post Partum Visit Note  Virginia Pearson is a 26 y.o. [phone] female who presents for a postpartum visit. She is 4 weeks postpartum following a primary cesarean section for twin delivery.  I have fully reviewed the prenatal and intrapartum course. The delivery was at 33.2 gestational weeks.  Anesthesia: spinal. Postpartum course has been uneventful. Babies are doing well, one twin is home and the other is getting discharged soon. Baby is feeding by breast. Bleeding staining only. Bowel function is normal. Bladder function is normal. Patient is not sexually active. Contraception method is Depo-Provera injections. Postpartum depression screening: negative.   The pregnancy intention screening data noted above was reviewed. Potential methods of contraception were discussed. The patient elected to proceed with No data recorded.   Edinburgh Postnatal Depression Scale - 12/07/21 1020       Edinburgh Postnatal Depression Scale:  In the Past 7 Days   I have been able to laugh and see the funny side of things. 0    I have looked forward with enjoyment to things. 0    I have blamed myself unnecessarily when things went wrong. 1    I have been anxious or worried for no good reason. 0    I have felt scared or panicky for no good reason. 0    Things have been getting on top of me. 0    I have been so unhappy that I have had difficulty sleeping. 0    I have felt sad or miserable. 0    I have been so unhappy that I have been crying. 1    The thought of harming myself has occurred to me. 0    Edinburgh Postnatal Depression Scale Total 2             Health Maintenance Due  Topic Date Due   FOOT EXAM  Never done   OPHTHALMOLOGY EXAM  Never done   URINE MICROALBUMIN  Never done   HPV VACCINES (1 - 2-dose series) Never done    The following portions of the patient's history were reviewed and updated as appropriate: allergies, current medications, past family history, past medical history,  past social history, past surgical history, and problem list.  Review of Systems Pertinent items noted in HPI and remainder of comprehensive ROS otherwise negative.  Objective:  BP 127/64   Pulse 90   Wt 219 lb 9.6 oz (99.6 kg)   LMP  (LMP Unknown)   Breastfeeding Yes   BMI 42.89 kg/m    General:  alert, cooperative, and appears stated age   Breasts:  not indicated  Lungs: Comfortalbe on room air  Wound well approximated incision  GU exam:  not indicated         Assessment:    There are no diagnoses linked to this encounter.  Normal postpartum exam.   Plan:   Essential components of care per ACOG recommendations:  1.  Mood and well being: Patient with negative depression screening today. Reviewed local resources for support.  - Patient tobacco use? No.   - hx of drug use? No.    2. Infant care and feeding:  -Patient currently breastmilk feeding? Yes. Reviewed importance of draining breast regularly to support lactation.  -Social determinants of health (SDOH) reviewed in EPIC. Offered food market  3. Sexuality, contraception and birth spacing - Patient does not want a pregnancy in the next year.  Desired family size is 4 children.  - Reviewed reproductive life planning.  Reviewed contraceptive methods based on pt preferences and effectiveness.  Patient desired Hormonal Injection today.   - Discussed birth spacing of 18 months  4. Sleep and fatigue -Encouraged family/partner/community support of 4 hrs of uninterrupted sleep to help with mood and fatigue  5. Physical Recovery  - Discussed patients delivery and complications. She describes her labor as bad. - Patient had a C-section emergent.  - Patient has urinary incontinence? No. - Patient is safe to resume physical and sexual activity  6.  Health Maintenance - HM due items addressed No - up to date - Last pap smear No results found for: "DIAGPAP" Pap smear not done at today's visit. NILM 09/29/2020 -Breast  Cancer screening indicated? No.   7. Chronic Disease/Pregnancy Condition follow up: Hypertension, Gestational Diabetes, and Horseshoe Kidney - CHTN: normotensive off nifedipine for the past three days, will cont with no meds - DM2: PCP follow up. Does not currently have one, emphasized repeatedly importance of finding one to manage her chronic conditions - Horseshoe kidney: Urology follow up   Venora Maples, MD [facility], [facility]

## 2021-12-07 ENCOUNTER — Other Ambulatory Visit: Payer: Self-pay

## 2021-12-07 ENCOUNTER — Telehealth: Payer: Self-pay | Admitting: General Practice

## 2021-12-07 ENCOUNTER — Ambulatory Visit (INDEPENDENT_AMBULATORY_CARE_PROVIDER_SITE_OTHER): Payer: Medicaid Other | Admitting: Family Medicine

## 2021-12-07 ENCOUNTER — Ambulatory Visit: Payer: Self-pay

## 2021-12-07 ENCOUNTER — Encounter: Payer: Self-pay | Admitting: Family Medicine

## 2021-12-07 DIAGNOSIS — Q631 Lobulated, fused and horseshoe kidney: Secondary | ICD-10-CM | POA: Diagnosis not present

## 2021-12-07 DIAGNOSIS — O24919 Unspecified diabetes mellitus in pregnancy, unspecified trimester: Secondary | ICD-10-CM

## 2021-12-07 DIAGNOSIS — O10919 Unspecified pre-existing hypertension complicating pregnancy, unspecified trimester: Secondary | ICD-10-CM

## 2021-12-07 DIAGNOSIS — Z30013 Encounter for initial prescription of injectable contraceptive: Secondary | ICD-10-CM | POA: Diagnosis not present

## 2021-12-07 MED ORDER — MEDROXYPROGESTERONE ACETATE 150 MG/ML IM SUSP
150.0000 mg | Freq: Once | INTRAMUSCULAR | Status: AC
  Administered 2021-12-07: 150 mg via INTRAMUSCULAR

## 2021-12-07 NOTE — Lactation Note (Signed)
This note was copied from a baby's chart.  NICU Lactation Consultation Note  Patient Name: Virginia Pearson [id number] Date: 12/07/2021 Age:24 wk.o.   Subjective Reason for consult: Follow-up assessment; NICU baby  Lactation provided discharge community support resources for parent of baby Virginia Pearson. Baby will receive follow up at the [facility]; I placed a referral to the [facility] there for OP follow up support.   Per parent of patient, feeding plan is partial breastfeeding with supplementation via bottle with an interest in transitioning more towards the breast under the guidance of a provider in OP setting.  Objective  Infant feeding assessment Scale for Readiness: 2 Scale for Quality: 2  Maternal data: [id number]  C-Section, Low Transverse   WIC Program: Yes WIC Referral Sent?: Yes Pump: Personal (WIC pump and portable DEBP (rechargeable) from [employer])  Assessment Infant: LATCH Score: 10  Feeding Status: Ad lib   Intervention/Plan Interventions: Support pillows  Plan: Consult Status: Complete   Virginia Pearson 12/07/2021, 3:25 PM

## 2021-12-07 NOTE — Telephone Encounter (Signed)
Patient called and left voicemail message in Spanish. Unsure what patient was requesting as no spanish interpreter available for translation. Will need to call patient back with an interpreter

## 2021-12-08 NOTE — Telephone Encounter (Signed)
Call placed to pt. Spoke with pt with interpreter Eda. Pt states thought she had missed a call from [place]. We called her about lactation and she talked with Jasmine December, Charity fundraiser. No further questions or assistance at this time.  Luellen Howson,RNC

## 2022-02-06 ENCOUNTER — Ambulatory Visit: Payer: Medicaid Other

## 2022-02-22 ENCOUNTER — Other Ambulatory Visit: Payer: Self-pay

## 2022-02-22 ENCOUNTER — Ambulatory Visit (INDEPENDENT_AMBULATORY_CARE_PROVIDER_SITE_OTHER): Payer: Medicaid Other | Admitting: *Deleted

## 2022-02-22 VITALS — BP 130/73 | HR 67 | Ht 60.0 in | Wt 220.2 lb

## 2022-02-22 DIAGNOSIS — Z3042 Encounter for surveillance of injectable contraceptive: Secondary | ICD-10-CM | POA: Diagnosis not present

## 2022-02-22 MED ORDER — MEDROXYPROGESTERONE ACETATE 150 MG/ML IM SUSP
150.0000 mg | Freq: Once | INTRAMUSCULAR | Status: AC
  Administered 2022-02-22: 150 mg via INTRAMUSCULAR

## 2022-02-22 NOTE — Progress Notes (Signed)
Depo provera 150 mg administered IM as scheduled.  Pt stated that she had heavy bleeding for last 2 weeks of September. She began to have light bleeding 5 days ago. I advised pt that this type of irregular bleeding is not uncommon after having a baby and receiving first Depo injection on 8/2. I advised her that we would expect her bleeding to become less after this injection and instructed her to let us know if she has another episode of heavy bleeding. Next injection due 1/3-1/17/24. Last Pap 07/02/20 - normal.  Pt was advised that she will require Annual Gyn exam after 12/08/22. She voiced understanding of all information and instructions given.

## 2022-03-20 ENCOUNTER — Ambulatory Visit: Payer: Self-pay

## 2022-05-10 ENCOUNTER — Ambulatory Visit: Payer: Medicaid Other

## 2022-05-10 ENCOUNTER — Ambulatory Visit (INDEPENDENT_AMBULATORY_CARE_PROVIDER_SITE_OTHER): Payer: Medicaid Other

## 2022-05-10 DIAGNOSIS — Z3042 Encounter for surveillance of injectable contraceptive: Secondary | ICD-10-CM

## 2022-05-10 MED ORDER — MEDROXYPROGESTERONE ACETATE 150 MG/ML IM SUSP
150.0000 mg | Freq: Once | INTRAMUSCULAR | Status: AC
  Administered 2022-05-10: 150 mg via INTRAMUSCULAR

## 2022-05-10 NOTE — Progress Notes (Signed)
Virginia Pearson here for Depo-Provera Injection. Injection administered without complication. Patient will return in 3 months for next injection between 03/21 and 04/04. Next annual visit due August 2024.   Martina Sinner, RN 05/10/2022  10:09 AM

## 2022-06-26 ENCOUNTER — Emergency Department (HOSPITAL_COMMUNITY)
Admission: EM | Admit: 2022-06-26 | Discharge: 2022-06-27 | Disposition: A | Discharge status: Other Acute Inpatient Hospital | Payer: Medicaid Other | Attending: Emergency Medicine | Admitting: Emergency Medicine

## 2022-06-26 ENCOUNTER — Other Ambulatory Visit: Payer: Self-pay

## 2022-06-26 DIAGNOSIS — G43909 Migraine, unspecified, not intractable, without status migrainosus: Secondary | ICD-10-CM | POA: Diagnosis not present

## 2022-06-26 DIAGNOSIS — R519 Headache, unspecified: Secondary | ICD-10-CM | POA: Diagnosis present

## 2022-06-26 DIAGNOSIS — G43409 Hemiplegic migraine, not intractable, without status migrainosus: Secondary | ICD-10-CM

## 2022-06-26 MED ORDER — KETOROLAC TROMETHAMINE 15 MG/ML IJ SOLN
15.0000 mg | Freq: Once | INTRAMUSCULAR | Status: AC
  Administered 2022-06-26: 15 mg via INTRAVENOUS
  Filled 2022-06-26: qty 1

## 2022-06-26 MED ORDER — PROCHLORPERAZINE EDISYLATE 10 MG/2ML IJ SOLN
10.0000 mg | Freq: Once | INTRAMUSCULAR | Status: AC
  Administered 2022-06-26: 10 mg via INTRAVENOUS
  Filled 2022-06-26: qty 2

## 2022-06-26 MED ORDER — ACETAMINOPHEN 500 MG PO TABS
1000.0000 mg | ORAL_TABLET | Freq: Once | ORAL | Status: AC
  Administered 2022-06-26: 1000 mg via ORAL
  Filled 2022-06-26: qty 2

## 2022-06-26 MED ORDER — DIPHENHYDRAMINE HCL 25 MG PO CAPS
25.0000 mg | ORAL_CAPSULE | Freq: Once | ORAL | Status: AC
  Administered 2022-06-26: 25 mg via ORAL
  Filled 2022-06-26: qty 1

## 2022-06-26 MED ORDER — LACTATED RINGERS IV BOLUS
1000.0000 mL | Freq: Once | INTRAVENOUS | Status: AC
  Administered 2022-06-26: 1000 mL via INTRAVENOUS

## 2022-06-26 NOTE — ED Triage Notes (Signed)
Pt c/o worsening migraine since Sunday. Pain associated with dizziness and light sensitivity. No medication PTA. Hx of Migraines.  Pt requires spanish speaking interpreter

## 2022-06-26 NOTE — ED Provider Notes (Signed)
[facility] Provider Note   CSN: [id number] Arrival date & time: 06/26/22  2105     History {Add pertinent medical, surgical, social history, OB history to HPI:1} Chief Complaint  Patient presents with   Migraine    Virginia Pearson is a 27 y.o. female with *** presents with migraine.  Patient states that every year she gets the same headache migraine located all over her head, feels like sharp pressure, with +photophobia and lightheadedness with walking. No visual changes, numbness/tingling, asymmetric weakness, nausea/vomiting. Has not taken anything for pain today. States she is not currently pregnant.    Migraine       Home Medications Prior to Admission medications   Medication Sig Start Date End Date Taking? Authorizing Provider  ibuprofen (ADVIL) 600 MG tablet Take 600 mg by mouth every 6 (six) hours as needed.    [provider]  oxyCODONE (OXY IR/ROXICODONE) 5 MG immediate release tablet Take 1 tablet (5 mg total) by mouth every 6 (six) hours as needed for severe pain or breakthrough pain. 11/10/21   Anyanwu, Sallyanne Havers, MD  Prenatal Vit-Fe Fumarate-FA (PREPLUS) 27-1 MG TABS Take 1 tablet by mouth daily. 09/28/21   Clarnce Flock, MD      Allergies    Patient has no known allergies.    Review of Systems   Review of Systems Review of systems {pos/neg:18640::"Negative","Positive"} for ***.  A 10 point review of systems was performed and is negative unless otherwise reported in HPI.  Physical Exam Updated Vital Signs BP (!) 144/104   Pulse 88   Temp 98.5 F (36.9 C) (Oral)   Resp 20   Ht 5' (1.524 m)   Wt 99.9 kg   SpO2 100%   BMI 43.00 kg/m  Physical Exam General: Normal appearing {Desc; female/female:11659}, lying in bed.  HEENT: PERRLA, Sclera anicteric, MMM, trachea midline.  Cardiology: RRR, no murmurs/rubs/gallops. BL radial and DP pulses equal bilaterally.  Resp: Normal respiratory rate and  effort. CTAB, no wheezes, rhonchi, crackles.  Abd: Soft, non-tender, non-distended. No rebound tenderness or guarding.  GU: Deferred. MSK: No peripheral edema or signs of trauma. Extremities without deformity or TTP. No cyanosis or clubbing. Skin: warm, dry. No rashes or lesions. Back: No CVA tenderness Neuro: A&Ox4, CNs II-XII grossly intact. MAEs. Sensation grossly intact.  Psych: Normal mood and affect.   ED Results / Procedures / Treatments   Labs (all labs ordered are listed, but only abnormal results are displayed) Labs Reviewed - No data to display  EKG None  Radiology No results found.  Procedures Procedures  {Document cardiac monitor, telemetry assessment procedure when appropriate:1}  Medications Ordered in ED Medications - No data to display  ED Course/ Medical Decision Making/ A&P                          Medical Decision Making   This patient presents to the ED for concern of ***, this involves an extensive number of treatment options, and is a complaint that carries with it a high risk of complications and morbidity.  I considered the following differential and admission for this acute, potentially life threatening condition.   MDM:    ***     Labs: I Ordered, and personally interpreted labs.  The pertinent results include:  ***  Imaging Studies ordered: I ordered imaging studies including *** I independently visualized and interpreted imaging. I agree with the radiologist  interpretation  Additional history obtained from ***.  External records from outside source obtained and reviewed including ***  Cardiac Monitoring: The patient was maintained on a cardiac monitor.  I personally viewed and interpreted the cardiac monitored which showed an underlying rhythm of: ***  Reevaluation: After the interventions noted above, I reevaluated the patient and found that they have :{resolved/improved/worsened:23923::"improved"}  Social Determinants of  Health: ***  Disposition:  ***  Co morbidities that complicate the patient evaluation  Past Medical History:  Diagnosis Date   Chest pain    Gestational diabetes    Headache    Kidney infection    SOB (shortness of breath)    Tachycardia      Medicines No orders of the defined types were placed in this encounter.   I have reviewed the patients home medicines and have made adjustments as needed  Problem List / ED Course: Problem List Items Addressed This Visit   None        {Document critical care time when appropriate:1} {Document review of labs and clinical decision tools ie heart score, Chads2Vasc2 etc:1}  {Document your independent review of radiology images, and any outside records:1} {Document your discussion with family members, caretakers, and with consultants:1} {Document social determinants of health affecting pt's care:1} {Document your decision making why or why not admission, treatments were needed:1}  This note was created using dictation software, which may contain spelling or grammatical errors.

## 2022-06-27 NOTE — ED Provider Notes (Signed)
12:46 AM Patient's headache significantly improved after IV medications.  Patient states she is ready to go home.       Corinthian Mizrahi, Jenny Reichmann, MD 06/27/22 [phone]

## 2022-07-27 ENCOUNTER — Other Ambulatory Visit: Payer: Self-pay

## 2022-07-27 ENCOUNTER — Ambulatory Visit (INDEPENDENT_AMBULATORY_CARE_PROVIDER_SITE_OTHER): Payer: Medicaid Other

## 2022-07-27 VITALS — BP 122/74 | HR 76 | Ht 60.0 in | Wt 220.0 lb

## 2022-07-27 DIAGNOSIS — Z3042 Encounter for surveillance of injectable contraceptive: Secondary | ICD-10-CM

## 2022-07-27 MED ORDER — MEDROXYPROGESTERONE ACETATE 150 MG/ML IM SUSP
150.0000 mg | Freq: Once | INTRAMUSCULAR | Status: AC
  Administered 2022-07-27: 150 mg via INTRAMUSCULAR

## 2022-07-27 NOTE — Progress Notes (Signed)
Virginia Pearson here for Depo-Provera Injection. Injection administered without complication. Patient will return in 3 months for next injection between June 6 and June 20. Pt advised to schedule annual visit among checking out with front office.   Verdell Carmine, RN 07/27/2022  10:24 AM

## 2022-09-26 ENCOUNTER — Ambulatory Visit (INDEPENDENT_AMBULATORY_CARE_PROVIDER_SITE_OTHER): Payer: Medicaid Other | Admitting: Family Medicine

## 2022-09-26 ENCOUNTER — Other Ambulatory Visit: Payer: Self-pay

## 2022-09-26 ENCOUNTER — Encounter: Payer: Self-pay | Admitting: Family Medicine

## 2022-09-26 VITALS — BP 137/86 | HR 69 | Ht 60.0 in | Wt 226.3 lb

## 2022-09-26 DIAGNOSIS — Z01419 Encounter for gynecological examination (general) (routine) without abnormal findings: Secondary | ICD-10-CM

## 2022-09-26 DIAGNOSIS — E119 Type 2 diabetes mellitus without complications: Secondary | ICD-10-CM

## 2022-09-26 DIAGNOSIS — O10919 Unspecified pre-existing hypertension complicating pregnancy, unspecified trimester: Secondary | ICD-10-CM

## 2022-09-26 DIAGNOSIS — Z8632 Personal history of gestational diabetes: Secondary | ICD-10-CM

## 2022-09-26 LAB — HEMOGLOBIN A1C
Est. average glucose Bld gHb Est-mCnc: 140 mg/dL
Hgb A1c MFr Bld: 6.5 % — ABNORMAL HIGH (ref 4.8–5.6)

## 2022-09-26 NOTE — Assessment & Plan Note (Signed)
-   Normotensive, no meds

## 2022-09-26 NOTE — Progress Notes (Signed)
GYNECOLOGY OFFICE VISIT NOTE  History:   Virginia Pearson is a 27 y.o. [phone] here today for annual visit.  No concerns  Health Maintenance Due  Topic Date Due   FOOT EXAM  Never done   OPHTHALMOLOGY EXAM  Never done   HPV VACCINES (1 - 2-dose series) Never done   HEMOGLOBIN A1C  12/12/2021   Diabetic kidney evaluation - Urine ACR  06/14/2022    Past Medical History:  Diagnosis Date   Chest pain    Gestational diabetes    Headache    Kidney infection    SOB (shortness of breath)    Tachycardia     Past Surgical History:  Procedure Laterality Date   CESAREAN SECTION MULTI-GESTATIONAL N/A 11/07/2021   Procedure: CESAREAN SECTION MULTI-GESTATIONAL;  Surgeon: Venora Maples, MD;  Location: [facility] LD ORS;  Service: Obstetrics;  Laterality: N/A;   NO PAST SURGERIES      The following portions of the patient's history were reviewed and updated as appropriate: allergies, current medications, past family history, past medical history, past social history, past surgical history and problem list.   Health Maintenance:   Last pap: No results found for: "DIAGPAP", "HPV", "HPVHIGH" 09/29/2020 - NILM  Last mammogram:  N/a    Review of Systems:  Pertinent items noted in HPI and remainder of comprehensive ROS otherwise negative.  Physical Exam:  BP 137/86   Pulse 69   Ht 5' (1.524 m)   Wt 226 lb 4.8 oz (102.6 kg)   LMP  (LMP Unknown)   Breastfeeding No   BMI 44.20 kg/m  CONSTITUTIONAL: Well-developed, well-nourished female in no acute distress.  HEENT:  Normocephalic, atraumatic. External right and left ear normal. No scleral icterus.  NECK: Normal range of motion, supple, no masses noted on observation SKIN: No rash noted. Not diaphoretic. No erythema. No pallor. MUSCULOSKELETAL: Normal range of motion. No edema noted. NEUROLOGIC: Alert and oriented to person, place, and time. Normal muscle tone coordination.  PSYCHIATRIC: Normal mood and affect. Normal behavior.  Normal judgment and thought content. RESPIRATORY: Effort normal, no problems with respiration noted  Labs and Imaging No results found for this or any previous visit (from the past 168 hour(s)). No results found.    Assessment and Plan:   Problem List Items Addressed This Visit       Cardiovascular and Mediastinum   Chronic hypertension complicating pregnancy, antepartum    Normotensive, no meds        Other   History of gestational diabetes    Check A1c today, last one was prediabetic range      Relevant Orders   HgB A1c   Well woman exam - Primary    Cancer screening: - Pap: up to date - Mammogram: n/a - Colonoscopy: n/a  Contraception: Using depo, likes the method  Mood: Flowsheet Row Office Visit [facility] in [facility]  PHQ-9 Total Score 1       Vaccinations: - Flu: out of season   Metabolic: - DM: check A1c today, prediabetic at last check - Lipids: not indicated  ID: - STI: declines         Routine preventative health maintenance measures emphasized. Please refer to After Visit Summary for other counseling recommendations.   Return in about 1 year (around 09/26/2023) for Annual Wellness Visit.    Total face-to-face time with patient: 20 minutes.  Over 50% of encounter was spent on counseling and  coordination of care.   Venora Maples, MD/MPH Attending Family Medicine Physician, [facility], [facility]

## 2022-09-26 NOTE — Patient Instructions (Signed)
Eleccin del mtodo anticonceptivo Contraception Choices La anticoncepcin, o los mtodos anticonceptivos, hace referencia a los mtodos o dispositivos que evitan el [place]. Mtodos hormonales  Implante anticonceptivo Un implante anticonceptivo consiste en un tubo delgado de plstico que contiene una hormona que evita el [place]. Es diferente de un dispositivo intrauterino (DIU). Un mdico lo inserta en la parte superior del brazo. Los implantes pueden ser eficaces durante un mximo de 3 aos. Inyecciones de progestina sola Las inyecciones de progestina sola contienen progestina, una forma sinttica de la hormona progesterona. Un mdico las administra cada 3 meses. Pldoras anticonceptivas Las pldoras anticonceptivas son pastillas que contienen hormonas que evitan el [place]. Deben tomarse una vez al da, preferentemente a la misma Economist. Se necesita una receta para utilizar este mtodo anticonceptivo. Parche anticonceptivo El parche anticonceptivo contiene hormonas que evitan el [place]. Se coloca en la piel, debe cambiarse una vez a la semana durante tres semanas y debe retirarse en la cuarta semana. Se necesita una receta para utilizar este mtodo anticonceptivo. Anillo vaginal Un anillo vaginal contiene hormonas que evitan el embarazo. Se coloca en la vagina durante tres semanas y se retira en la cuarta semana. Luego se repite el proceso con un anillo nuevo. Se necesita una receta para utilizar este mtodo anticonceptivo. Anticonceptivo de emergencia Los anticonceptivos de emergencia son mtodos para evitar un embarazo despus de Warehouse manager sexo sin proteccin. Vienen en forma de pldora y pueden tomarse hasta 5 das despus de [place]. Funcionan mejor cuando se toman lo ms pronto posible luego de eBay. La mayora de los anticonceptivos de emergencia estn disponibles sin receta mdica. Este mtodo no debe utilizarse como el nico mtodo anticonceptivo. Mtodos de  barrera  Condn masculino Un condn masculino es una vaina delgada que se coloca sobre el pene durante el sexo. Los condones evitan que el esperma ingrese en el cuerpo de la [place]. Pueden utilizarse con un una sustancia que mata a los espermatozoides (espermicida) para aumentar la efectividad. Deben desecharse despus de un uso. Condn femenino Un condn femenino es una vaina blanda y holgada que se coloca en la vagina antes de [place]. El condn evita que el esperma ingrese en el cuerpo de la [place]. Deben desecharse despus de un uso. Diafragma Un diafragma es una barrera blanda con forma de cpula. Se inserta en la vagina antes del sexo, junto con un espermicida. El diafragma bloquea el ingreso de esperma en el tero, y el espermicida mata a los espermatozoides. El Designer, fashion/clothing en la vagina durante 6 a 8 horas despus de Warehouse manager sexo y debe retirarse en el plazo de las 24 horas. Un diafragma es recetado y colocado por un mdico. Debe reemplazarse cada 1 a 2 aos, despus de dar a luz, de aumentar ms de 15lb (6.8kg) y de [place] plvica. Capuchn cervical Un capuchn cervical es una copa redonda y blanda de ltex o plstico que se coloca en el cuello uterino. Se inserta en la vagina antes del sexo, junto con un espermicida. Bloquea el ingreso del esperma en el tero. El capuchn Radio producer durante 6 a 8 horas despus de Warehouse manager sexo y debe retirarse en el plazo de las 48 horas. Un capuchn cervical debe ser recetado y colocado por un mdico. Debe reemplazarse cada 2aos. Esponja Una esponja es una pieza blanda y circular de espuma de poliuretano que contiene espermicida. La esponja ayuda a bloquear el ingreso de esperma en el tero, y el espermicida  mata a los espermatozoides. Belva Bertin, debe humedecerla e insertarla en la vagina. Debe insertarse antes de eBay, debe permanecer dentro al menos durante 6 horas despus de tener sexo y debe retirarse y  Nurse, adult en el plazo de las 30 horas. Espermicidas Los espermicidas son sustancias qumicas que matan o bloquean al esperma y no lo dejan ingresar al cuello uterino y al tero. Vienen en forma de crema, gel, supositorio, espuma o comprimido. Un espermicida debe insertarse en la vagina con un aplicador al menos 10 o 15 minutos antes de tener sexo para dar tiempo a que surta [place]. El proceso debe repetirse cada vez que tenga sexo. Los espermicidas no requieren Emergency planning/management officer. Anticonceptivos intrauterinos Dispositivo intrauterino (DIU) Un DIU es un dispositivo en forma de T que se coloca en el tero. Existen dos tipos: DIU hormonal.Este tipo contiene progestina, una forma sinttica de la hormona progesterona. Este tipo puede permanecer colocado durante 3 a 5 aos. DIU de cobre.Este tipo est recubierto con un alambre de cobre. Puede permanecer colocado durante 10 aos. Mtodos anticonceptivos permanentes Ligadura de trompas en la mujer En este mtodo, se sellan, atan u obstruyen las trompas de Falopio durante una ciruga para Automotive engineer que el vulo descienda [place]. Esterilizacin histeroscpica En este mtodo, se coloca un implante pequeo y flexible dentro de cada trompa de Falopio. Los implantes hacen que se forme un tejido cicatricial en las trompas de Falopio y que las obstruya para que el espermatozoide no pueda llegar al vulo. El procedimiento demora alrededor de 3 meses para que sea [place]. Debe utilizarse otro mtodo anticonceptivo durante esos 3 meses. Esterilizacin masculina Este es un procedimiento que consiste en atar los conductos que transportan el esperma (vasectoma). Luego del procedimiento, el hombre Manufacturing engineer lquido (semen). Debe utilizarse otro mtodo anticonceptivo durante 3 meses despus del procedimiento. Mtodos de planificacin natural Planificacin familiar natural En este mtodo, la pareja no tiene [employer] la mujer podra quedar  [place]. Mtodo calendario En este mtodo, la mujer realiza un seguimiento de la duracin de cada ciclo menstrual, identifica los [employer] que se puede producir un Psychiatrist y no tiene sexo durante esos [address]  [address]. Mtodo de la ovulacin En este mtodo, la pareja evita tener sexo durante la ovulacin. Mtodo sintotrmico Este mtodo implica no tener sexo durante la ovulacin. Normalmente, la mujer comprueba la ovulacin al observar cambios en su temperatura y en la consistencia del moco cervical. Mtodo posovulacin En este mtodo, la pareja espera a que finalice la ovulacin para Doctor, hospital. Dnde buscar ms informacin Centers for Disease Control and Prevention (Centros para el Control y Psychiatrist de Event organiser): [email or web address] Resumen La anticoncepcin, o los mtodos anticonceptivos, hace referencia a los mtodos o dispositivos que evitan el [place]. Los mtodos anticonceptivos hormonales incluyen implantes, inyecciones, pastillas, parches, anillos vaginales y anticonceptivos de Associate Professor. Los mtodos anticonceptivos de barrera pueden incluir condones masculinos, condones femeninos, diafragmas, capuchones cervicales, esponjas y espermicidas. [employer] Life [employer] tipos de DIU (dispositivo intrauterino). Un DIU puede colocarse en el tero de una mujer para evitar el embarazo durante 3 a 5 aos. La esterilizacin permanente puede realizarse mediante un procedimiento tanto en los hombres como en las mujeres. Los [employer] de Medical sales representative natural implican no tener [employer] la mujer podra quedar [place]. Esta informacin no tiene Theme park manager el consejo del mdico. Asegrese de hacerle al mdico cualquier pregunta que tenga. Document Revised: 11/25/2019 Document Reviewed: 11/25/2019 Elsevier Patient Education  2023 Elsevier Inc.  Cuidados preventivos en las Wiley de 21 a 39 aos de [employer] 47-67 Years Old, Female Los cuidados preventivos hacen  referencia a las opciones en cuanto al estilo de vida y a las visitas al mdico, las cuales pueden promover la salud y Counsellor. Las visitas de cuidado preventivo tambin se denominan exmenes de Health visitor. Qu puedo esperar para mi visita de cuidado preventivo? Asesoramiento Durante la visita de cuidado preventivo, el mdico puede preguntarle sobre lo siguiente: Antecedentes mdicos, incluidos los siguientes: Problemas mdicos pasados. Antecedentes mdicos familiares. Antecedentes de embarazo. Salud actual, incluido lo siguiente: [place] menstrual. Mtodos anticonceptivos. Su bienestar emocional. Training and development officer y las relaciones personales. Actividad sexual y salud sexual. Doran Clay de vida, incluido lo siguiente: Consumo de alcohol, nicotina, tabaco o drogas. Acceso a armas de fuego. Hbitos de alimentacin, ejercicio y sueo. Su trabajo y [place] laboral. Uso de pantalla solar. Cuestiones de seguridad, como el uso de cinturn de seguridad y casco de Scientist, research (physical sciences). Examen fsico El mdico puede controlar lo siguiente: Mirian Mo y [place]. Estos pueden usarse para calcular el IMC (ndice de masa corporal). El [facility] es una medicin que indica si tiene un peso saludable. Circunferencia de la cintura. Es Neomia Dear medicin alrededor de Lobbyist. Esta medicin tambin indica si tiene un peso saludable y puede ayudar a predecir su riesgo de padecer ciertas enfermedades, como diabetes tipo 2 y presin arterial alta. Frecuencia cardaca y presin arterial. Temperatura corporal. Piel para detectar manchas anormales. Qu vacunas necesito?  Las vacunas se aplican a varias edades, segn un cronograma. El [employer] recomendar vacunas segn su edad, sus antecedentes mdicos, su estilo de vida y [address], como los viajes o el lugar donde trabaja. Qu pruebas necesito? Pruebas de deteccin El mdico puede recomendar pruebas de deteccin de ciertas afecciones. Esto puede incluir: Examen plvico  y prueba de Papanicolaou. Niveles de lpidos y colesterol. Pruebas de deteccin de la diabetes. Esto se Physiological scientist un control del azcar en la sangre (glucosa) despus de no haber comido durante un periodo de tiempo (ayuno). Prueba de hepatitis B. Prueba de hepatitisC. Prueba del VIH (virus de inmunodeficiencia humana). Pruebas de infecciones de transmisin sexual (ITS), si est en riesgo. Pruebas de deteccin de cncer relacionado con las mutaciones del BRCA. Es posible que se las Tour manager si tiene antecedentes de cncer de mama, de ovario, de trompas o peritoneal. Hable con su mdico sobre los [place] de las pruebas, las opciones de tratamiento y, si corresponde, la necesidad de Education officer, environmental ms pruebas. Siga estas instrucciones en su casa: Comida y bebida  Siga una dieta saludable que incluya frutas y verduras frescas, cereales integrales, protenas magras y productos lcteos descremados. Tome los suplementos vitamnicos y [employer]inois se lo haya indicado el mdico. No beba alcohol si: Su mdico le indica no hacerlo. Est embarazada, puede estar embarazada o est tratando de [place]. Si bebe alcohol: Limite la cantidad que consume de 0 a 1 medida por da. Sepa cunta cantidad de alcohol hay en las bebidas que toma. En los [place], una medida equivale a una botella de cerveza de 12oz ( ), un vaso de vino de 5oz ( ) o un vaso de una bebida alcohlica de alta graduacin de 1oz (44ml). Estilo de [employer] dientes a la maana y a la noche con Conservator, museum/gallery con fluoruro. Use hilo dental una vez al da. Haga al menos 30 minutos de ejercicio, 5 o ms [address]. No consuma ningn  producto que contenga nicotina o tabaco. Estos productos incluyen cigarrillos, tabaco para Theatre manager y aparatos de vapeo, como los Administrator, Civil Service. Si necesita ayuda para dejar de fumar, consulte al mdico. No consuma drogas. Si es sexualmente activa,  practique sexo seguro. Use un condn u otra forma de proteccin para prevenir las infecciones de transmisin sexual (ITS). Si no desea quedar embarazada, use un mtodo anticonceptivo. Si busca un embarazo, realice una consulta previa al embarazo con el mdico. Busque maneras saludables de Charity fundraiser, tales como: Meditacin, yoga o Optometrist. Lleve un diario personal. Hable con una persona confiable. Pase tiempo con amigos y familiares. Minimice la exposicin a la radiacin UV para reducir el riesgo de cncer de piel. Seguridad [place] siempre el cinturn de seguridad al conducir o viajar en un vehculo. No conduzca: Si ha estado bebiendo alcohol. No viaje con un conductor que ha estado bebiendo. Si ha estado usando sustancias o drogas que alteran la funcin mental. Mientras est enviando mensajes de texto. Si est cansada o distrada. Use un casco y otros equipos de proteccin durante las actividades deportivas. Si tiene armas de fuego en su casa, asegrese de seguir todos los procedimientos de seguridad correspondientes. Busque ayuda si fue vctima de abuso fsico o abuso sexual. Cundo volver? Acuda al mdico una vez al ao para una visita anual de control de bienestar. Pregntele al mdico con qu frecuencia debe realizarse un control de la vista y los dientes. Mantenga su esquema de vacunacin al da. Esta informacin no tiene Theme park manager el consejo del mdico. Asegrese de hacerle al mdico cualquier pregunta que tenga. Document Revised: 11/10/2020 Document Reviewed: 11/10/2020 Elsevier Patient Education  2023 [employer].

## 2022-09-26 NOTE — Assessment & Plan Note (Signed)
Check A1c today, last one was prediabetic range

## 2022-09-26 NOTE — Assessment & Plan Note (Signed)
Cancer screening: - Pap: up to date - Mammogram: n/a - Colonoscopy: n/a  Contraception: Using depo, likes the method  Mood: Flowsheet Row Office Visit [facility]  PHQ-9 Total Score 1        Vaccinations: - Flu: out of season   Metabolic: - DM: check A1c today, prediabetic at last check - Lipids: not indicated  ID: - STI: declines

## 2022-09-27 ENCOUNTER — Telehealth: Payer: Self-pay

## 2022-09-27 ENCOUNTER — Encounter: Payer: Self-pay | Admitting: Family Medicine

## 2022-09-27 ENCOUNTER — Other Ambulatory Visit: Payer: Self-pay

## 2022-09-27 DIAGNOSIS — E119 Type 2 diabetes mellitus without complications: Secondary | ICD-10-CM

## 2022-09-27 HISTORY — DX: Type 2 diabetes mellitus without complications: E11.9

## 2022-09-27 MED ORDER — METFORMIN HCL 500 MG PO TABS
500.0000 mg | ORAL_TABLET | Freq: Two times a day (BID) | ORAL | 5 refills | Status: DC
  Filled 2022-09-27: qty 60, 30d supply, fill #0
  Filled 2022-11-06 – 2022-11-15 (×2): qty 60, 30d supply, fill #1
  Filled 2023-01-10: qty 60, 30d supply, fill #2
  Filled 2023-02-13 – 2023-03-27 (×2): qty 60, 30d supply, fill #3
  Filled 2023-05-29: qty 60, 30d supply, fill #4

## 2022-09-27 NOTE — Telephone Encounter (Signed)
Call placed to pt with interpreter Eda. Spoke with pt. Pt given results per Dr Crissie Reese. Pt verbalized understanding. Pt aware of where Rx Metformin was sent. Referral placed for PCP.  Judeth Cornfield, RNC

## 2022-09-27 NOTE — Telephone Encounter (Signed)
-----   Message from Venora Maples, MD sent at 09/27/2022  8:39 AM EDT ----- Please let patient know she now has type 2 diabetes. She needs to get to a PCP. I also recommend she start metformin 500 mg BID, I have sent this to her pharmacy.

## 2022-09-27 NOTE — Addendum Note (Signed)
Addended by: Merian Capron on: 09/27/2022 08:39 AM   Modules accepted: Orders

## 2022-10-12 ENCOUNTER — Other Ambulatory Visit: Payer: Self-pay

## 2022-10-12 ENCOUNTER — Ambulatory Visit (INDEPENDENT_AMBULATORY_CARE_PROVIDER_SITE_OTHER): Payer: Medicaid Other | Admitting: General Practice

## 2022-10-12 VITALS — BP 122/70 | HR 67 | Ht 60.0 in | Wt 225.0 lb

## 2022-10-12 DIAGNOSIS — Z3042 Encounter for surveillance of injectable contraceptive: Secondary | ICD-10-CM

## 2022-10-12 MED ORDER — MEDROXYPROGESTERONE ACETATE 150 MG/ML IM SUSY
150.0000 mg | PREFILLED_SYRINGE | Freq: Once | INTRAMUSCULAR | Status: AC
  Administered 2022-10-12: 150 mg via INTRAMUSCULAR

## 2022-10-12 NOTE — Progress Notes (Signed)
Virginia Pearson here for Depo-Provera Injection. Injection administered without complication. Patient will return in 3 months for next injection between 8/22 and 9/5. Next annual visit due May 2025.   [name], RN 10/12/2022  11:04 AM

## 2022-11-06 ENCOUNTER — Other Ambulatory Visit: Payer: Self-pay

## 2022-11-13 ENCOUNTER — Other Ambulatory Visit: Payer: Self-pay

## 2022-11-15 ENCOUNTER — Other Ambulatory Visit: Payer: Self-pay

## 2022-12-27 ENCOUNTER — Telehealth: Payer: Self-pay | Admitting: General Practice

## 2022-12-27 NOTE — Telephone Encounter (Signed)
Patient called and left message on nurse voicemail line stating she wants to know when her next appt is.   Called patient with Eda assisting with Spanish interpretation, no answer- left message stating she has an appt with [place] tomorrow at 9am.

## 2022-12-28 ENCOUNTER — Ambulatory Visit: Payer: Medicaid Other

## 2023-01-01 ENCOUNTER — Telehealth: Payer: Self-pay | Admitting: General Practice

## 2023-01-01 NOTE — Telephone Encounter (Signed)
Patient called and left message on nurse voicemail line asking when her next appt is. Per chart review, patient missed appt on 8/22.   Called patient with Debarah Crape assisting with spanish interpretation. Rescheduled missed appt for 9/5. Patient verbalized understanding.

## 2023-01-10 ENCOUNTER — Other Ambulatory Visit: Payer: Self-pay

## 2023-01-10 NOTE — Progress Notes (Deleted)
Virginia Pearson here for Depo-Provera Injection. Last dose given on 10/12/22; patient is 13w since last injeciton--within window to receive next dose today. Injection administered without complication. Patient will return in 3 months for next injection between 03/29/23 and 04/12/23. Next annual visit due 09/26/23.   [name], RN 01/11/23 at

## 2023-01-11 ENCOUNTER — Ambulatory Visit: Payer: Medicaid Other

## 2023-01-12 ENCOUNTER — Other Ambulatory Visit: Payer: Self-pay

## 2023-01-24 ENCOUNTER — Other Ambulatory Visit: Payer: Self-pay

## 2023-01-24 ENCOUNTER — Ambulatory Visit (INDEPENDENT_AMBULATORY_CARE_PROVIDER_SITE_OTHER): Payer: Medicaid Other | Admitting: *Deleted

## 2023-01-24 VITALS — BP 117/73 | HR 70 | Ht 60.0 in | Wt 224.8 lb

## 2023-01-24 DIAGNOSIS — Z3202 Encounter for pregnancy test, result negative: Secondary | ICD-10-CM

## 2023-01-24 DIAGNOSIS — Z30013 Encounter for initial prescription of injectable contraceptive: Secondary | ICD-10-CM

## 2023-01-24 DIAGNOSIS — Z30019 Encounter for initial prescription of contraceptives, unspecified: Secondary | ICD-10-CM

## 2023-01-24 LAB — POCT PREGNANCY, URINE: Preg Test, Ur: NEGATIVE

## 2023-01-24 MED ORDER — MEDROXYPROGESTERONE ACETATE 150 MG/ML IM SUSY
150.0000 mg | PREFILLED_SYRINGE | Freq: Once | INTRAMUSCULAR | Status: AC
  Administered 2023-01-24: 150 mg via INTRAMUSCULAR

## 2023-01-24 NOTE — Progress Notes (Signed)
Here for depo-provera . It has been [redacted]w[redacted]d since last dose. She reports one episode of intercourse in last 2 weeks on 01/21/23 and used condoms. UPT today negative. Per protocol depo-provera given without complaint. Sent to desk to make appointment for next injection. Annual and pap due May 2025. Advised use condoms for 2 weeks.she voices understanding. Nancy Fetter

## 2023-02-13 ENCOUNTER — Other Ambulatory Visit: Payer: Self-pay

## 2023-02-23 ENCOUNTER — Other Ambulatory Visit: Payer: Self-pay

## 2023-03-04 ENCOUNTER — Encounter (HOSPITAL_COMMUNITY): Payer: Self-pay

## 2023-03-04 ENCOUNTER — Emergency Department (HOSPITAL_COMMUNITY)
Admission: EM | Admit: 2023-03-04 | Discharge: 2023-03-04 | Disposition: A | Discharge status: Home / Self Care | Payer: Medicaid Other | Attending: Emergency Medicine | Admitting: Emergency Medicine

## 2023-03-04 ENCOUNTER — Emergency Department (HOSPITAL_COMMUNITY): Payer: Medicaid Other

## 2023-03-04 DIAGNOSIS — E119 Type 2 diabetes mellitus without complications: Secondary | ICD-10-CM | POA: Insufficient documentation

## 2023-03-04 DIAGNOSIS — N12 Tubulo-interstitial nephritis, not specified as acute or chronic: Secondary | ICD-10-CM | POA: Insufficient documentation

## 2023-03-04 DIAGNOSIS — R000 Tachycardia, unspecified: Secondary | ICD-10-CM | POA: Diagnosis not present

## 2023-03-04 DIAGNOSIS — Z1152 Encounter for screening for COVID-19: Secondary | ICD-10-CM | POA: Insufficient documentation

## 2023-03-04 DIAGNOSIS — R109 Unspecified abdominal pain: Secondary | ICD-10-CM | POA: Diagnosis present

## 2023-03-04 LAB — RESP PANEL BY RT-PCR (RSV, FLU A&B, COVID)  RVPGX2
Influenza A by PCR: NEGATIVE
Influenza B by PCR: NEGATIVE
Resp Syncytial Virus by PCR: NEGATIVE
SARS Coronavirus 2 by RT PCR: NEGATIVE

## 2023-03-04 LAB — COMPREHENSIVE METABOLIC PANEL
ALT: 15 U/L (ref 0–44)
AST: 15 U/L (ref 15–41)
Albumin: 4.1 g/dL (ref 3.5–5.0)
Alkaline Phosphatase: 117 U/L (ref 38–126)
Anion gap: 10 (ref 5–15)
BUN: 9 mg/dL (ref 6–20)
CO2: 22 mmol/L (ref 22–32)
Calcium: 9.4 mg/dL (ref 8.9–10.3)
Chloride: 103 mmol/L (ref 98–111)
Creatinine, Ser: 0.53 mg/dL (ref 0.44–1.00)
GFR, Estimated: >60 mL/min (ref >60)
Glucose, Bld: 162 mg/dL — ABNORMAL HIGH (ref 70–99)
Potassium: 3.5 mmol/L (ref 3.5–5.1)
Sodium: 135 mmol/L (ref 135–145)
Total Bilirubin: 0.8 mg/dL (ref 0.3–1.2)
Total Protein: 8.3 g/dL — ABNORMAL HIGH (ref 6.5–8.1)

## 2023-03-04 LAB — MAGNESIUM: Magnesium: 1.9 mg/dL (ref 1.7–2.4)

## 2023-03-04 LAB — CBC WITH DIFFERENTIAL/PLATELET
Abs Immature Granulocytes: 0.04 10*3/uL (ref 0.00–0.07)
Basophils Absolute: 0 10*3/uL (ref 0.0–0.1)
Basophils Relative: 0 %
Eosinophils Absolute: 0 10*3/uL (ref 0.0–0.5)
Eosinophils Relative: 0 %
HCT: 35.2 % — ABNORMAL LOW (ref 36.0–46.0)
Hemoglobin: 11.8 g/dL — ABNORMAL LOW (ref 12.0–15.0)
Immature Granulocytes: 0 %
Lymphocytes Relative: 10 %
Lymphs Abs: 1.2 10*3/uL (ref 0.7–4.0)
MCH: 25.1 pg — ABNORMAL LOW (ref 26.0–34.0)
MCHC: 33.5 g/dL (ref 30.0–36.0)
MCV: 74.9 fL — ABNORMAL LOW (ref 80.0–100.0)
Monocytes Absolute: 0.6 10*3/uL (ref 0.1–1.0)
Monocytes Relative: 5 %
Neutro Abs: 9.8 10*3/uL — ABNORMAL HIGH (ref 1.7–7.7)
Neutrophils Relative %: 85 %
Platelets: 331 10*3/uL (ref 150–400)
RBC: 4.7 MIL/uL (ref 3.87–5.11)
RDW: 14.6 % (ref 11.5–15.5)
WBC: 11.7 10*3/uL — ABNORMAL HIGH (ref 4.0–10.5)
nRBC: 0 % (ref 0.0–0.2)

## 2023-03-04 LAB — URINALYSIS, W/ REFLEX TO CULTURE (INFECTION SUSPECTED)
Bacteria, UA: NONE SEEN
Bilirubin Urine: NEGATIVE
Glucose, UA: NEGATIVE mg/dL
Ketones, ur: NEGATIVE mg/dL
Nitrite: NEGATIVE
Protein, ur: NEGATIVE mg/dL
Specific Gravity, Urine: 1.036 — ABNORMAL HIGH (ref 1.005–1.030)
pH: 7 (ref 5.0–8.0)

## 2023-03-04 LAB — I-STAT CHEM 8, ED
BUN: 6 mg/dL (ref 6–20)
Calcium, Ion: 1.23 mmol/L (ref 1.15–1.40)
Chloride: 99 mmol/L (ref 98–111)
Creatinine, Ser: 0.6 mg/dL (ref 0.44–1.00)
Glucose, Bld: 89 mg/dL (ref 70–99)
HCT: 35 % — ABNORMAL LOW (ref 36.0–46.0)
Hemoglobin: 11.9 g/dL — ABNORMAL LOW (ref 12.0–15.0)
Potassium: 3.5 mmol/L (ref 3.5–5.1)
Sodium: 136 mmol/L (ref 135–145)
TCO2: 23 mmol/L (ref 22–32)

## 2023-03-04 LAB — HCG, SERUM, QUALITATIVE: Preg, Serum: NEGATIVE

## 2023-03-04 LAB — I-STAT CG4 LACTIC ACID, ED: Lactic Acid, Venous: 1.2 mmol/L (ref 0.5–1.9)

## 2023-03-04 MED ORDER — FENTANYL CITRATE PF 50 MCG/ML IJ SOSY
50.0000 ug | PREFILLED_SYRINGE | Freq: Once | INTRAMUSCULAR | Status: AC
  Administered 2023-03-04: 50 ug via INTRAVENOUS
  Filled 2023-03-04: qty 1

## 2023-03-04 MED ORDER — LACTATED RINGERS IV BOLUS (SEPSIS)
1000.0000 mL | Freq: Once | INTRAVENOUS | Status: AC
  Administered 2023-03-04: 1000 mL via INTRAVENOUS

## 2023-03-04 MED ORDER — IOHEXOL 350 MG/ML SOLN
100.0000 mL | Freq: Once | INTRAVENOUS | Status: AC | PRN
  Administered 2023-03-04: 100 mL via INTRAVENOUS

## 2023-03-04 MED ORDER — CEPHALEXIN 500 MG PO CAPS: 500.0000 mg | ORAL_CAPSULE | Freq: Four times a day (QID) | ORAL | 0 refills | Status: AC

## 2023-03-04 MED ORDER — SODIUM CHLORIDE 0.9 % IV SOLN
1.0000 g | Freq: Once | INTRAVENOUS | Status: AC
  Administered 2023-03-04: 1 g via INTRAVENOUS
  Filled 2023-03-04: qty 10

## 2023-03-04 MED ORDER — ONDANSETRON 4 MG PO TBDP: 4.0000 mg | ORAL_TABLET | Freq: Three times a day (TID) | ORAL | 0 refills | Status: DC | PRN

## 2023-03-04 MED ORDER — KETOROLAC TROMETHAMINE 15 MG/ML IJ SOLN
15.0000 mg | Freq: Once | INTRAMUSCULAR | Status: AC
  Administered 2023-03-04: 15 mg via INTRAVENOUS
  Filled 2023-03-04: qty 1

## 2023-03-04 MED ORDER — ACETAMINOPHEN 325 MG PO TABS
650.0000 mg | ORAL_TABLET | Freq: Once | ORAL | Status: AC
  Administered 2023-03-04: 650 mg via ORAL
  Filled 2023-03-04: qty 2

## 2023-03-04 NOTE — ED Provider Notes (Signed)
EMERGENCY DEPARTMENT AT [facility] Provider Note   CSN: [id number] Arrival date & time: 03/04/23  1310     History  Chief Complaint  Patient presents with   Flank Pain    Virginia Pearson is a 27 y.o. female.  The history is provided by the patient. The history is limited by a language barrier. A language interpreter was used.  Flank Pain Associated symptoms include abdominal pain.  Patient presents for back and abdominal pain.  Medical history includes DM, horseshoe kidney.  She has had pain for the past week.  It started in her lower back.  She has since had worsening pain and pain is now located in lower aspect of abdomen, greater on the left.  In addition to her low back pain, which is bilateral, she also has pain in the right thoracic area of her back.  She has had urinary frequency with some dysuria.  She has had subjective fevers.  She denies any nausea or vomiting.  She took Tylenol at 4 AM this morning.  She has not taken any medication for pain since that time.     Home Medications Prior to Admission medications   Medication Sig Start Date End Date Taking? Authorizing Provider  cephALEXin (KEFLEX) 500 MG capsule Take 1 capsule (500 mg total) by mouth 4 (four) times daily for 10 days. 03/04/23 03/14/23 Yes Gloris Manchester, MD  ondansetron (ZOFRAN-ODT) 4 MG disintegrating tablet Take 1 tablet (4 mg total) by mouth every 8 (eight) hours as needed for nausea or vomiting. 03/04/23  Yes Gloris Manchester, MD  medroxyPROGESTERone (DEPO-PROVERA) 150 MG/ML injection Inject 150 mg into the muscle every 3 (three) months. Last injection 07/27/22    [provider]  metFORMIN (GLUCOPHAGE) 500 MG tablet Take 1 tablet (500 mg total) by mouth 2 (two) times daily with a meal. 09/27/22   Venora Maples, MD      Allergies    Patient has no known allergies.    Review of Systems   Review of Systems  Constitutional:  Positive for activity change, fatigue and  fever.  Gastrointestinal:  Positive for abdominal pain.  Genitourinary:  Positive for dysuria, flank pain and frequency.  Musculoskeletal:  Positive for back pain.  All other systems reviewed and are negative.   Physical Exam Updated Vital Signs BP (!) 144/89 (BP Location: Left Arm)   Pulse 99   Temp 100.3 F (37.9 C) (Oral)   Resp 16   SpO2 99%  Physical Exam Vitals and nursing note reviewed.  Constitutional:      General: She is not in acute distress.    Appearance: Normal appearance. She is well-developed. She is not ill-appearing, toxic-appearing or diaphoretic.  HENT:     Head: Normocephalic and atraumatic.     Right Ear: External ear normal.     Left Ear: External ear normal.     Nose: Nose normal.     Mouth/Throat:     Mouth: Mucous membranes are moist.  Eyes:     Extraocular Movements: Extraocular movements intact.     Conjunctiva/sclera: Conjunctivae normal.  Cardiovascular:     Rate and Rhythm: Regular rhythm. Tachycardia present.     Heart sounds: No murmur heard. Pulmonary:     Effort: Pulmonary effort is normal. No respiratory distress.     Breath sounds: Normal breath sounds. No wheezing or rales.  Chest:     Chest wall: No tenderness.  Abdominal:     General: There  is no distension.     Palpations: Abdomen is soft.     Tenderness: There is abdominal tenderness. There is right CVA tenderness and left CVA tenderness. There is no guarding or rebound.  Musculoskeletal:        General: No swelling or deformity. Normal range of motion.     Cervical back: Neck supple.     Right lower leg: No edema.     Left lower leg: No edema.  Skin:    General: Skin is warm and dry.     Capillary Refill: Capillary refill takes less than 2 seconds.     Coloration: Skin is not jaundiced or pale.  Neurological:     General: No focal deficit present.     Mental Status: She is alert and oriented to person, place, and time.  Psychiatric:        Mood and Affect: Mood normal.         Behavior: Behavior normal.     ED Results / Procedures / Treatments   Labs (all labs ordered are listed, but only abnormal results are displayed) Labs Reviewed  COMPREHENSIVE METABOLIC PANEL - Abnormal; Notable for the following components:      Result Value   Glucose, Bld 162 (*)    Total Protein 8.3 (*)    All other components within normal limits  CBC WITH DIFFERENTIAL/PLATELET - Abnormal; Notable for the following components:   WBC 11.7 (*)    Hemoglobin 11.8 (*)    HCT 35.2 (*)    MCV 74.9 (*)    MCH 25.1 (*)    Neutro Abs 9.8 (*)    All other components within normal limits  URINALYSIS, W/ REFLEX TO CULTURE (INFECTION SUSPECTED) - Abnormal; Notable for the following components:   Specific Gravity, Urine 1.036 (*)    Hgb urine dipstick SMALL (*)    Leukocytes,Ua LARGE (*)    All other components within normal limits  I-STAT CHEM 8, ED - Abnormal; Notable for the following components:   Hemoglobin 11.9 (*)    HCT 35.0 (*)    All other components within normal limits  RESP PANEL BY RT-PCR (RSV, FLU A&B, COVID)  RVPGX2  CULTURE, BLOOD (ROUTINE X 2)  CULTURE, BLOOD (ROUTINE X 2)  URINE CULTURE  HCG, SERUM, QUALITATIVE  MAGNESIUM  I-STAT CG4 LACTIC ACID, ED  I-STAT CG4 LACTIC ACID, ED    EKG EKG Interpretation Date/Time:  Sunday March 04 2023 13:58:00 EDT Ventricular Rate:  108 PR Interval:  156 QRS Duration:  81 QT Interval:  298 QTC Calculation: 400 R Axis:   70  Text Interpretation: Sinus tachycardia Nonspecific T abnormalities, diffuse leads Confirmed by Gloris Manchester ([id number]) on 03/04/2023 2:07:43 PM  Radiology CT Angio Chest PE W and/or Wo Contrast  Result Date: 03/04/2023 CLINICAL DATA:  High probability for pulmonary embolism. Bilateral flank pain that started last night. EXAM: CT ANGIOGRAPHY CHEST CT ABDOMEN AND PELVIS WITH CONTRAST TECHNIQUE: Multidetector CT imaging of the chest was performed using the standard protocol during bolus  administration of intravenous contrast. Multiplanar CT image reconstructions and MIPs were obtained to evaluate the vascular anatomy. Multidetector CT imaging of the abdomen and pelvis was performed using the standard protocol during bolus administration of intravenous contrast. RADIATION DOSE REDUCTION: This exam was performed according to the departmental dose-optimization program which includes automated exposure control, adjustment of the mA and/or kV according to patient size and/or use of iterative reconstruction technique. CONTRAST:  OMNIPAQUE IOHEXOL 350 MG/ML SOLN  COMPARISON:  02/11/2020 chest CTA FINDINGS: CTA CHEST FINDINGS Cardiovascular: Suboptimal but satisfactory opacification of the pulmonary arteries to the segmental level. No evidence of pulmonary embolism. Normal heart size. No pericardial effusion. Mediastinum/Nodes: Negative for mass or adenopathy Lungs/Pleura: There is no edema, consolidation, effusion, or pneumothorax. Musculoskeletal: Unremarkable Review of the MIP images confirms the above findings. CT ABDOMEN and PELVIS FINDINGS Lower chest:  No contributory findings. Hepatobiliary: No focal liver abnormality.No evidence of biliary obstruction or stone. Pancreas: Unremarkable. Spleen: Unremarkable. Adrenals/Urinary Tract: Negative adrenals. Horseshoe kidney. The lower pole right moiety shows ill-defined hypoenhancement and adjacent fat stranding. No abscess or hydronephrosis. Unremarkable bladder. Stomach/Bowel:  No obstruction. No appendicitis. Vascular/Lymphatic: No acute vascular abnormality. No mass or adenopathy. Reproductive:No pathologic findings. Other: No ascites or pneumoperitoneum. Musculoskeletal: No acute abnormalities. Review of the MIP images confirms the above findings. IMPRESSION: 1. 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 No abscess or hydronephrosis. 2. Negative for pulmonary embolism or other acute finding in the chest. Electronically Signed    By: Tiburcio Pea M.D.   On: 03/04/2023 16:15   CT ABDOMEN PELVIS W CONTRAST  Result Date: 03/04/2023 CLINICAL DATA:  High probability for pulmonary embolism. Bilateral flank pain that started last night. EXAM: CT ANGIOGRAPHY CHEST CT ABDOMEN AND PELVIS WITH CONTRAST TECHNIQUE: Multidetector CT imaging of the chest was performed using the standard protocol during bolus administration of intravenous contrast. Multiplanar CT image reconstructions and MIPs were obtained to evaluate the vascular anatomy. Multidetector CT imaging of the abdomen and pelvis was performed using the standard protocol during bolus administration of intravenous contrast. RADIATION DOSE REDUCTION: This exam was performed according to the departmental dose-optimization program which includes automated exposure control, adjustment of the mA and/or kV according to patient size and/or use of iterative reconstruction technique. CONTRAST:  OMNIPAQUE IOHEXOL 350 MG/ML SOLN COMPARISON:  02/11/2020 chest CTA FINDINGS: CTA CHEST FINDINGS Cardiovascular: Suboptimal but satisfactory opacification of the pulmonary arteries to the segmental level. No evidence of pulmonary embolism. Normal heart size. No pericardial effusion. Mediastinum/Nodes: Negative for mass or adenopathy Lungs/Pleura: There is no edema, consolidation, effusion, or pneumothorax. Musculoskeletal: Unremarkable Review of the MIP images confirms the above findings. CT ABDOMEN and PELVIS FINDINGS Lower chest:  No contributory findings. Hepatobiliary: No focal liver abnormality.No evidence of biliary obstruction or stone. Pancreas: Unremarkable. Spleen: Unremarkable. Adrenals/Urinary Tract: Negative adrenals. Horseshoe kidney. The lower pole right moiety shows ill-defined hypoenhancement and adjacent fat stranding. No abscess or hydronephrosis. Unremarkable bladder. Stomach/Bowel:  No obstruction. No appendicitis. Vascular/Lymphatic: No acute vascular abnormality. No mass or  adenopathy. Reproductive:No pathologic findings. Other: No ascites or pneumoperitoneum. Musculoskeletal: No acute abnormalities. Review of the MIP images confirms the above findings. IMPRESSION: 1. 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 No abscess or hydronephrosis. 2. Negative for pulmonary embolism or other acute finding in the chest. Electronically Signed   By: Tiburcio Pea M.D.   On: 03/04/2023 16:15   DG Chest Port 1 View  Result Date: 03/04/2023 CLINICAL DATA:  Questionable sepsis - evaluate for abnormality EXAM: PORTABLE CHEST 1 VIEW COMPARISON:  February 11, 2020 FINDINGS: The cardiomediastinal silhouette is unchanged in contour. No pleural effusion. No pneumothorax. No acute pleuroparenchymal abnormality. IMPRESSION: No acute cardiopulmonary abnormality. Electronically Signed   By: Meda Klinefelter M.D.   On: 03/04/2023 15:22    Procedures Procedures    Medications Ordered in ED Medications  lactated ringers bolus 1,000 mL (has no administration in time range)  lactated ringers bolus 1,000 mL (1,000  mLs Intravenous New Bag/Given 03/04/23 1409)  acetaminophen (TYLENOL) tablet 650 mg (650 mg Oral Given 03/04/23 1408)  fentaNYL (SUBLIMAZE) injection 50 mcg (50 mcg Intravenous Given 03/04/23 1408)  iohexol (OMNIPAQUE) 350 MG/ML injection 100 mL (100 mLs Intravenous Contrast Given 03/04/23 1448)  cefTRIAXone (ROCEPHIN) 1 g in sodium chloride 0.9 % 100 mL IVPB (1 g Intravenous New Bag/Given 03/04/23 1606)    ED Course/ Medical Decision Making/ A&P                                 Medical Decision Making Amount and/or Complexity of Data Reviewed Labs: ordered. Radiology: ordered. ECG/medicine tests: ordered.  Risk OTC drugs. Prescription drug management.   This patient presents to the ED for concern of abdominal and back pain, this involves an extensive number of treatment options, and is a complaint that carries with it a high risk of complications and  morbidity.  The differential diagnosis includes pyelonephritis, nephrolithiasis, constipation, colitis, PID   Co morbidities that complicate the patient evaluation  DM, horseshoe kidney   Additional history obtained:  Additional history obtained from N/A External records from outside source obtained and reviewed including EMR   Lab Tests:  I Ordered, and personally interpreted labs.  The pertinent results include: Mild leukocytosis is present.  Kidney function and electrolytes are normal.  Urinalysis shows pyuria.   Imaging Studies ordered:  I ordered imaging studies including chest x-ray, CTA chest, CT of abdomen and pelvis I independently visualized and interpreted imaging which showed findings consistent with pyelonephritis I agree with the radiologist interpretation   Cardiac Monitoring: / EKG:  The patient was maintained on a cardiac monitor.  I personally viewed and interpreted the cardiac monitored which showed an underlying rhythm of: Sinus rhythm  Problem List / ED Course / Critical interventions / Medication management  Patient presents for pain, starting in her lower back a week ago and since progressing to lower abdomen as well as right side of thoracic back.  Vital signs on arrival are notable for tachycardia, tachypnea, and low-grade fever.  Patient meets SIRS criteria.  Septic workup was initiated.  IV fluids were ordered for hydration.  Tylenol was ordered for antipyresis.  Fentanyl was ordered for analgesia.  Given recent symptoms, patient was treated empirically for UTI with ceftriaxone.  Although no bacteria were seen on urinalysis, she does have pyuria and CT findings consistent with pyelonephritis.  No other acute findings were identified.  Patient was given additional IV fluids.  On reassessment, patient is resting comfortably.  Given her horseshoe kidney, patient was offered admission for continued IV antibiotics and observation.  Patient declined this stating  that she had childcare applications.  She was prescribed ongoing antibiotics and advised to return at anytime for any worsening of symptoms.  Patient was discharged in stable condition. I ordered medication including Tylenol for antipyresis; fentanyl for analgesia; IV fluids and ceftriaxone for pyelonephritis Reevaluation of the patient after these medicines showed that the patient improved I have reviewed the patients home medicines and have made adjustments as needed   Social Determinants of Health:  Non-English-speaking        Final Clinical Impression(s) / ED Diagnoses Final diagnoses:  Pyelonephritis    Rx / DC Orders ED Discharge Orders          Ordered    cephALEXin (KEFLEX) 500 MG capsule  4 times daily  03/04/23 1720    ondansetron (ZOFRAN-ODT) 4 MG disintegrating tablet  Every 8 hours PRN        03/04/23 1720              Gloris Manchester, MD 03/04/23 1722

## 2023-03-04 NOTE — Discharge Instructions (Addendum)
You have a kidney infection.  You received your first dose of antibiotics in the emergency department.  A prescription for ongoing antibiotics (cephalexin) was sent to your pharmacy.  Take as prescribed.   Results of urine culture will be available in the next 1 to 2 days.  You should follow-up on these results with your primary care doctor to ensure that you are on an appropriate antibiotic.   A prescription for a medication called ondansetron was sent to your pharmacy as well.  Take this as needed for nausea.    Ensure that you drink plenty of fluids to stay hydrated.    Take ibuprofen and Tylenol for fevers and discomfort.    Return to the emergency department if you do have any worsening of symptoms.

## 2023-03-04 NOTE — ED Triage Notes (Signed)
Pt presents with c/o bilateral flank pain. Pt reports that she has a hx of pain and some swelling in her kidneys. Pt reports that the pain started last night.

## 2023-03-08 LAB — URINE CULTURE: Culture: >=100000 — AB

## 2023-03-09 ENCOUNTER — Telehealth (HOSPITAL_BASED_OUTPATIENT_CLINIC_OR_DEPARTMENT_OTHER): Payer: Self-pay

## 2023-03-09 LAB — CULTURE, BLOOD (ROUTINE X 2)
Culture: NO GROWTH
Culture: NO GROWTH
Special Requests: ADEQUATE
Special Requests: ADEQUATE

## 2023-03-09 NOTE — Telephone Encounter (Signed)
Post ED Visit - Positive Culture Follow-up  Culture report reviewed by antimicrobial stewardship pharmacist: [facility] []  Enzo Bi, Pharm.D. []  Celedonio Miyamoto, Pharm.D., BCPS AQ-ID []  Garvin Fila, Pharm.D., BCPS [x]  Georgina Pillion, Pharm.D., BCPS []  [place], [place].D., BCPS, AAHIVP []  Estella Husk, Pharm.D., BCPS, AAHIVP []  Lysle Pearl, PharmD, BCPS []  Phillips Climes, PharmD, BCPS []  Agapito Games, PharmD, BCPS []  Verlan Friends, PharmD []  Mervyn Gay, PharmD, BCPS []  Vinnie Level, PharmD  [facility] []  Len Childs, PharmD []  Greer Pickerel, PharmD []  Adalberto Cole, PharmD []  Perlie Gold, Rph []  Lonell Face) Jean Rosenthal, PharmD []  Earl Many, PharmD []  Junita Push, PharmD []  Dorna Leitz, PharmD []  Terrilee Files, PharmD []  Lynann Beaver, PharmD []  Keturah Barre, PharmD []  Loralee Pacas, PharmD []  Bernadene Person, PharmD   Positive urine culture Treated with Cephalexin, organism sensitive to the same and no further patient follow-up is required at this time.  Sandria Senter 03/09/2023, 9:06 AM

## 2023-03-27 ENCOUNTER — Other Ambulatory Visit: Payer: Self-pay

## 2023-04-10 NOTE — Progress Notes (Unsigned)
Virginia Pearson here for Depo-Provera Injection. Patient received last injection on 01/24/23; patient is 11w since last injection--within window to receive next dose today. Injection administered without complication. Patient will return in 3 months for next injection between 06/27/23 and 07/11/23. Next annual visit due 09/26/23.   [name], RN 04/11/23 at

## 2023-04-11 ENCOUNTER — Other Ambulatory Visit: Payer: Self-pay

## 2023-04-11 ENCOUNTER — Ambulatory Visit: Payer: Medicaid Other

## 2023-04-11 VITALS — BP 134/69 | HR 86 | Ht 60.0 in | Wt 230.8 lb

## 2023-04-11 DIAGNOSIS — Z3042 Encounter for surveillance of injectable contraceptive: Secondary | ICD-10-CM

## 2023-04-11 MED ORDER — MEDROXYPROGESTERONE ACETATE 150 MG/ML IM SUSY
150.0000 mg | PREFILLED_SYRINGE | Freq: Once | INTRAMUSCULAR | Status: AC
  Administered 2023-04-11: 150 mg via INTRAMUSCULAR

## 2023-05-25 IMAGING — US US OB TRANSVAGINAL
1 series · 14 of 28 positions shown · non-contrast
Comparison: None.

CLINICAL DATA: Pelvic pain

EXAM:
TWIN OBSTETRICAL ULTRASOUND <14 WKS
TECHNIQUE: Transabdominal ultrasound was performed for evaluation of the
gestation as well as the maternal uterus and adnexal regions.

[Series 1: us ob transvaginal · 211 acquisitions, 14 frames shown]
[im 8/211]
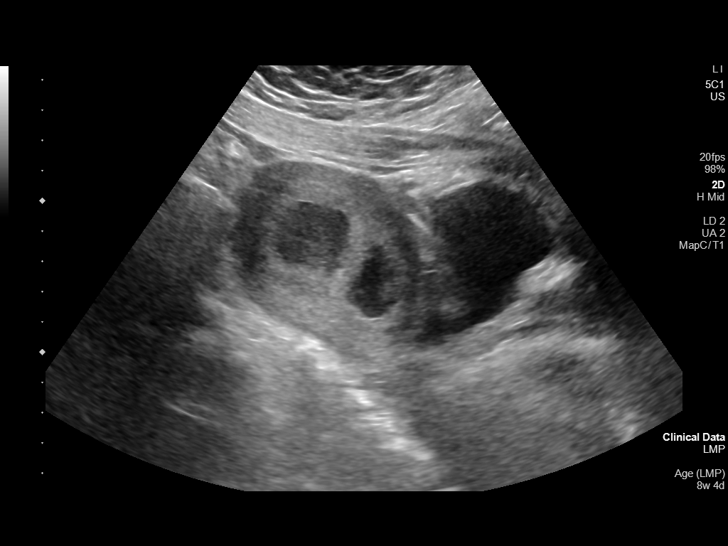
[im 24/211]
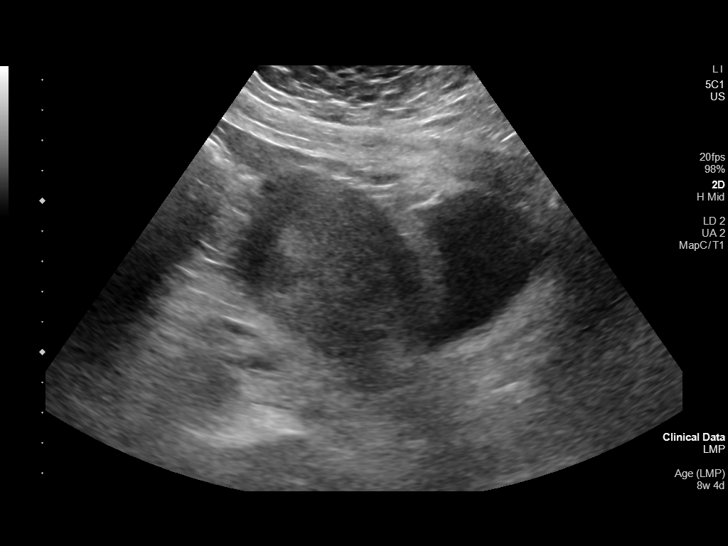
[im 39/211]
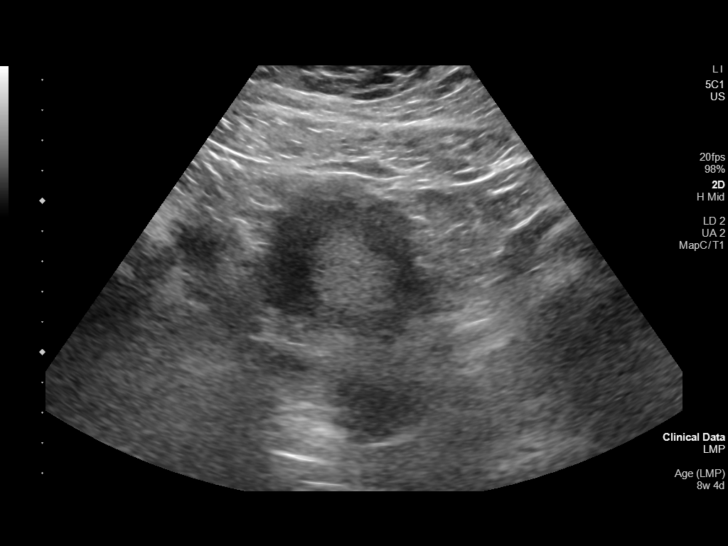
[im 55/211]
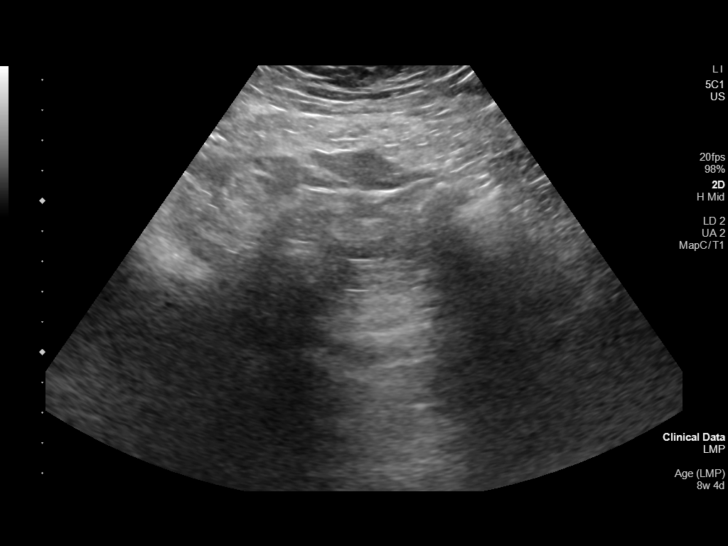
[im 71/211]
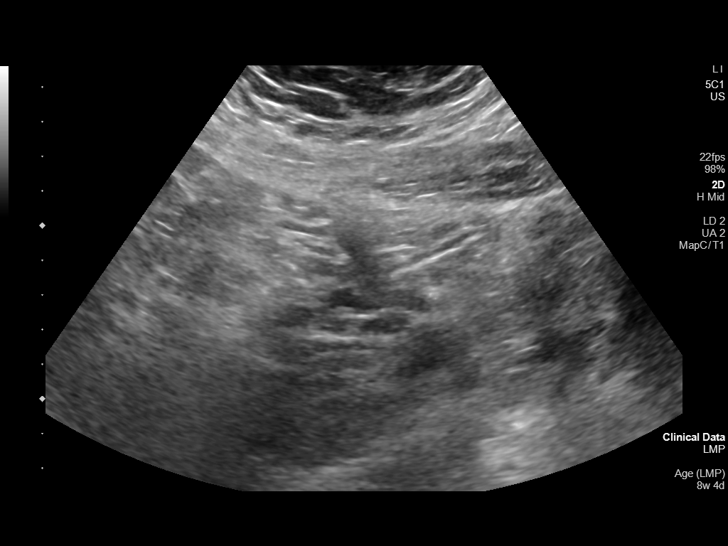
[im 86/211]
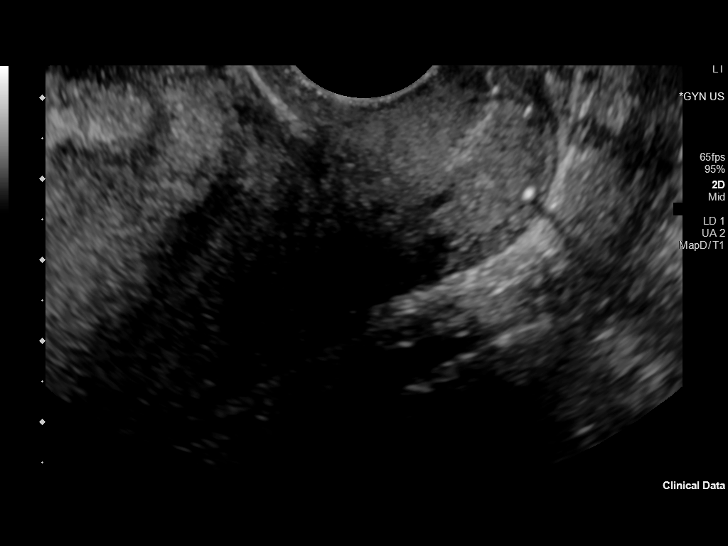
[im 102/211]
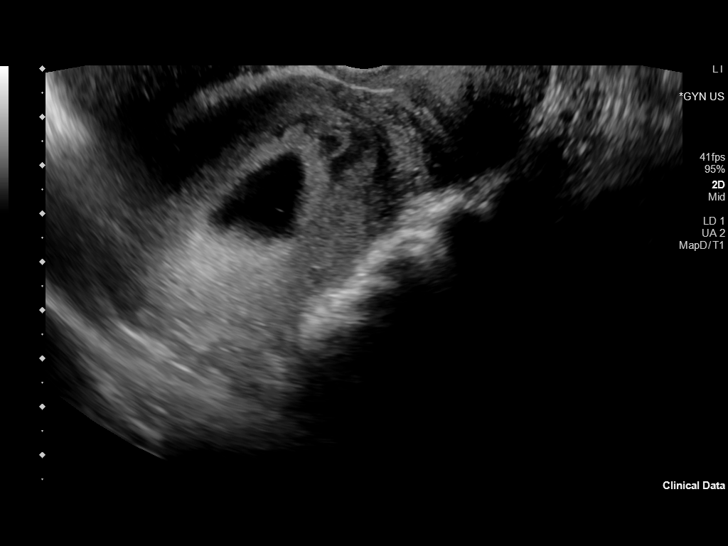
[im 117/211]
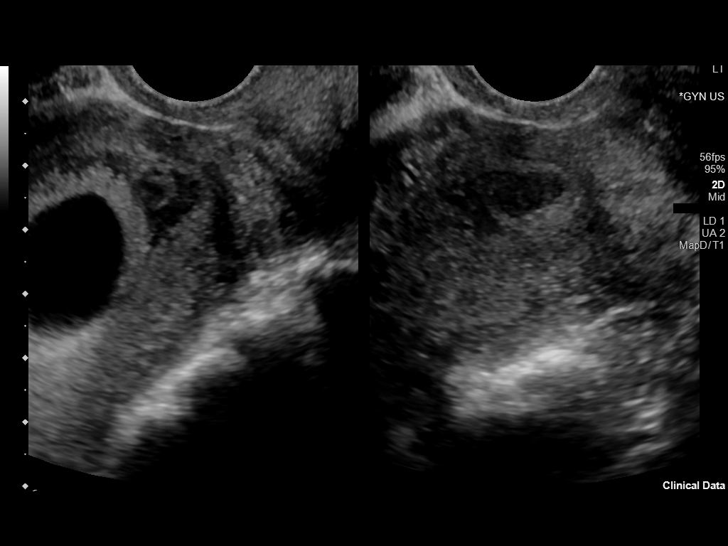
[im 133/211]
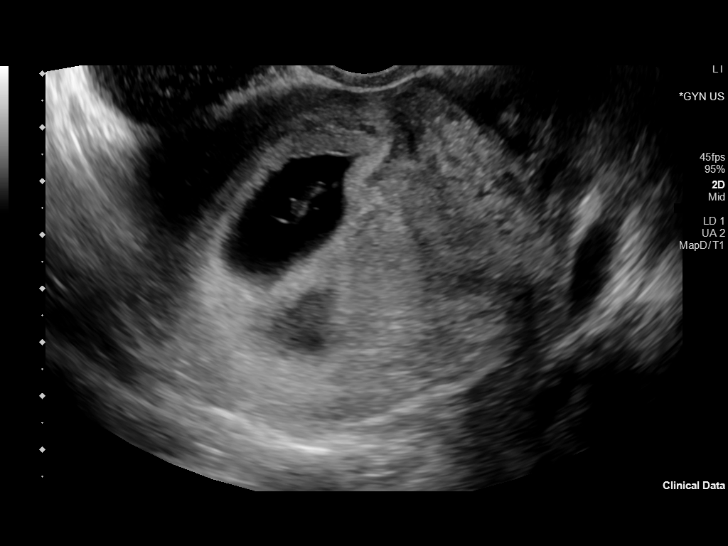
[im 148/211]
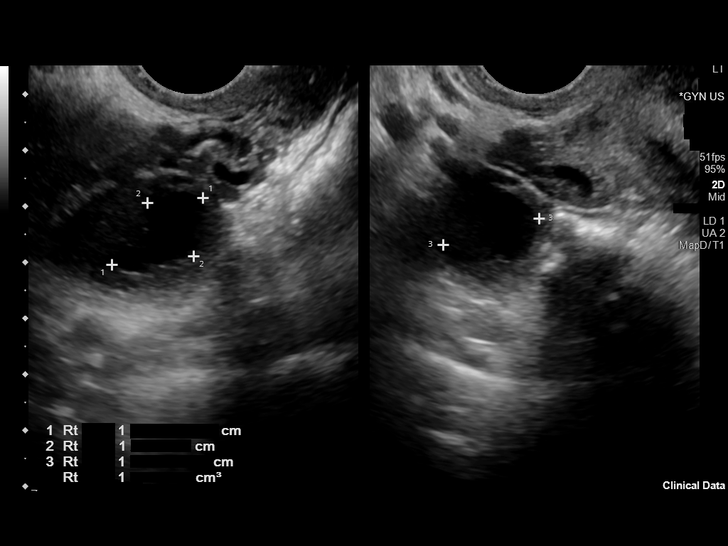
[im 164/211]
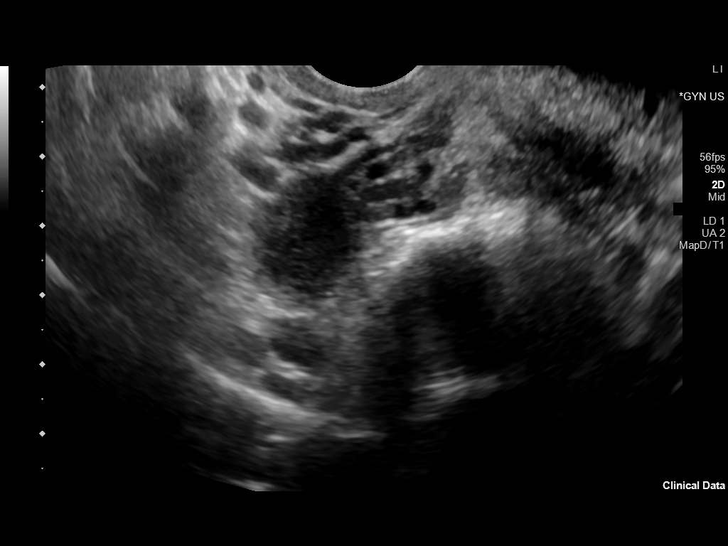
[im 179/211]
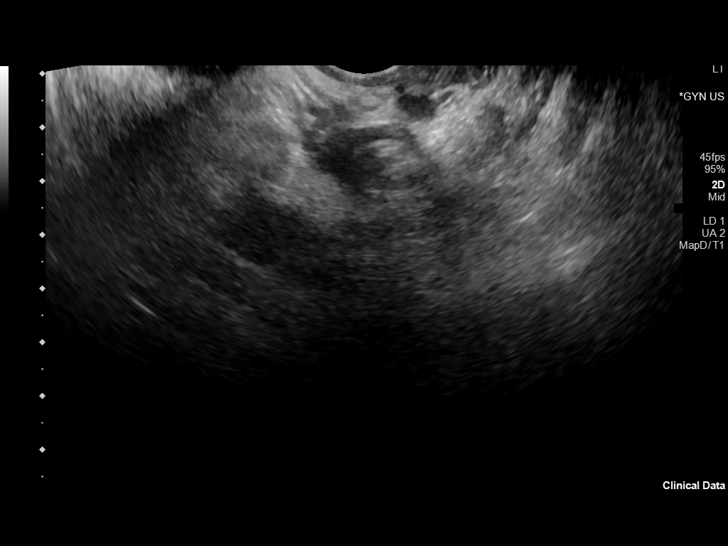
[im 195/211]
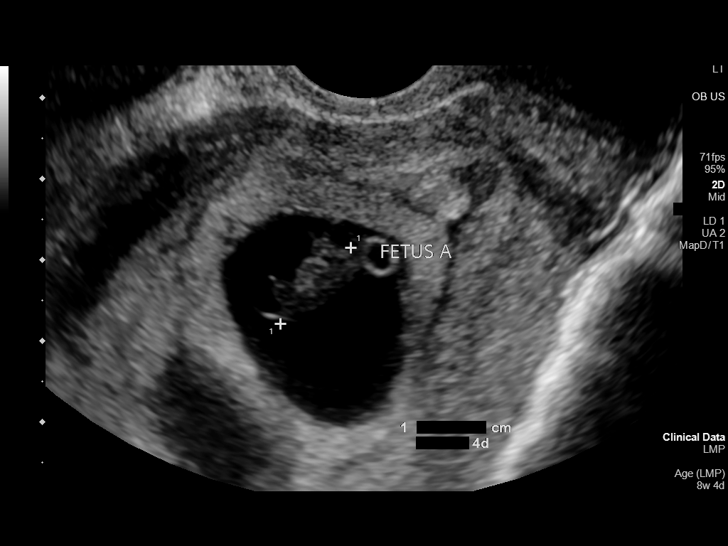
[im 211/211]
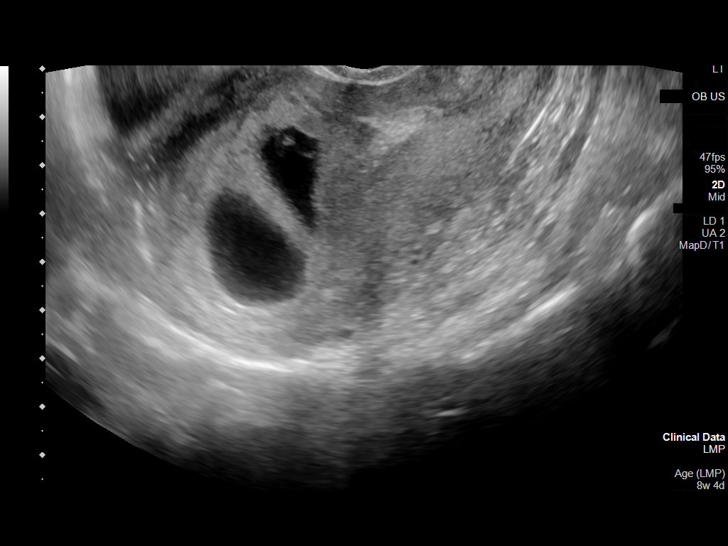

[14 of 28 positions shown; findings below may reference images not displayed]

FINDINGS: Number of IUPs:  2

Chorionicity/Amnionicity:  Dichorionic-diamniotic (thick membrane)

TWIN 1

Yolk sac:  Visualized.

Embryo:  Visualized.

Cardiac Activity: Visualized.

Heart Rate: 170 bpm

CRL:   12.8 mm   7 w 4 d                  US EDC: 12/24/2021

TWIN 2

Yolk sac:  Visualized.

Embryo:  Visualized.

Cardiac Activity: Visualized.

Heart Rate: 172 bpm

CRL:   13.6 mm   7 w 4 d                  US EDC: 12/24/2021

Subchorionic hemorrhage:  Trace subchorionic hemorrhage

Maternal uterus/adnexae: Right ovary is normal. Left ovary not
visualized. No free fluid in the cul-de-sac.
IMPRESSION: Live twin intrauterine gestations with approximate gestational age
of 7 weeks and 4 days. EDC based on today's sonogram is 12/24/2021.

## 2023-05-25 IMAGING — US US OB EACH ADDL GEST<[ID]
1 series · 14 of 28 positions shown · non-contrast
Comparison: None.

CLINICAL DATA: Pelvic pain

EXAM:
TWIN OBSTETRICAL ULTRASOUND <14 WKS
TECHNIQUE: Transabdominal ultrasound was performed for evaluation of the
gestation as well as the maternal uterus and adnexal regions.

[Series 1: us ob each addl gest<(id) · 211 acquisitions, 14 frames shown]
[im 8/211]
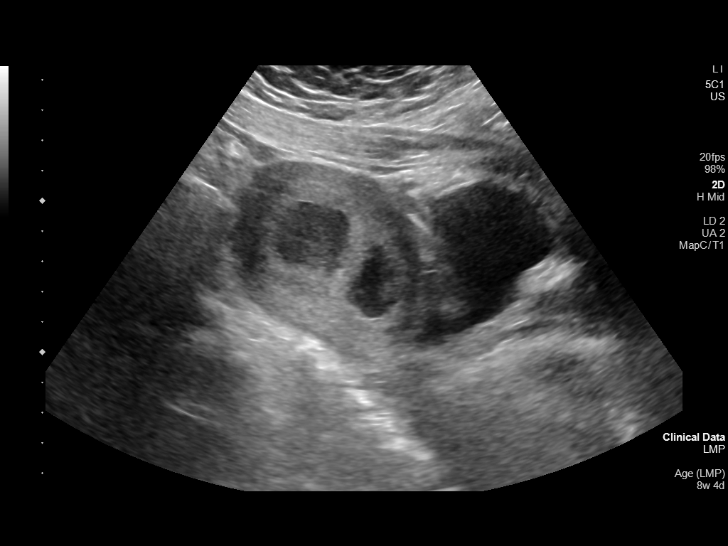
[im 24/211]
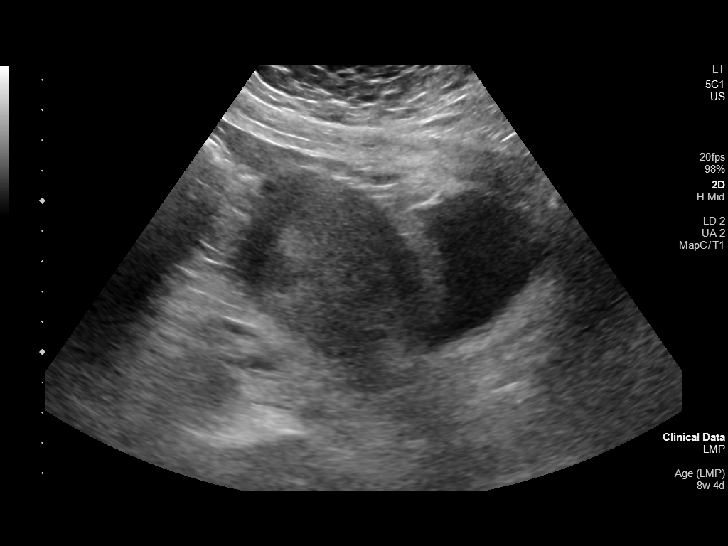
[im 39/211]
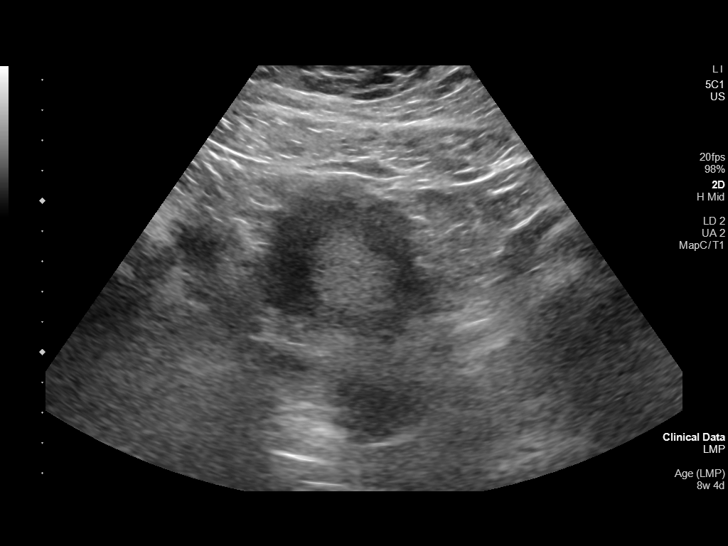
[im 55/211]
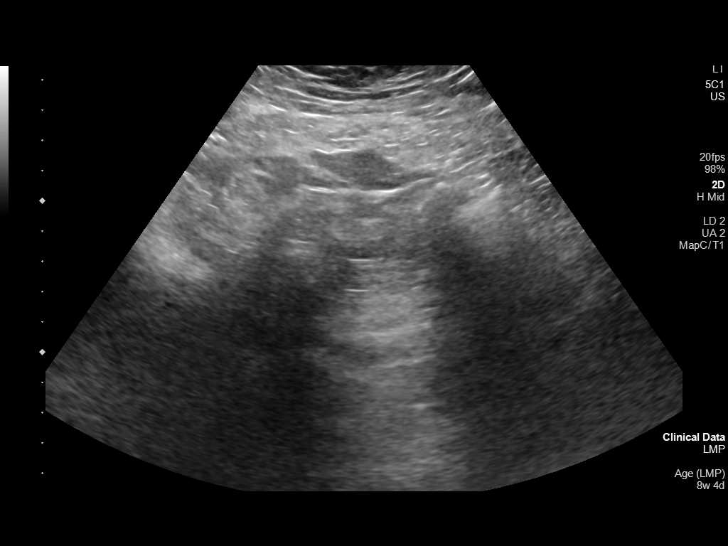
[im 71/211]
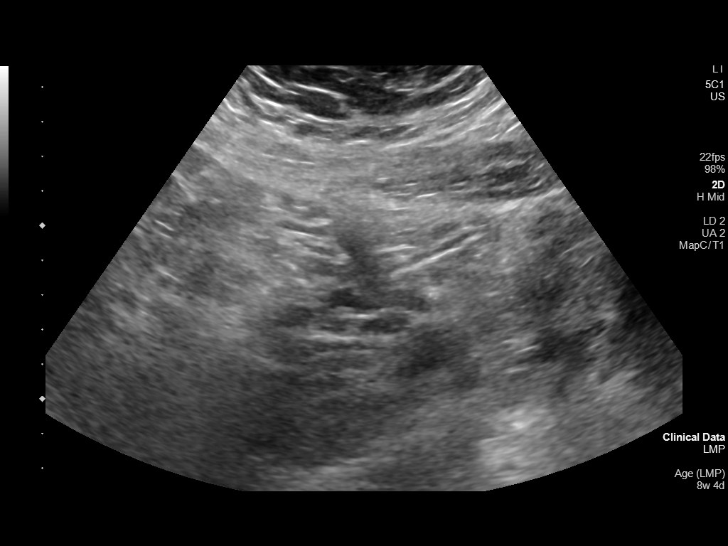
[im 86/211]
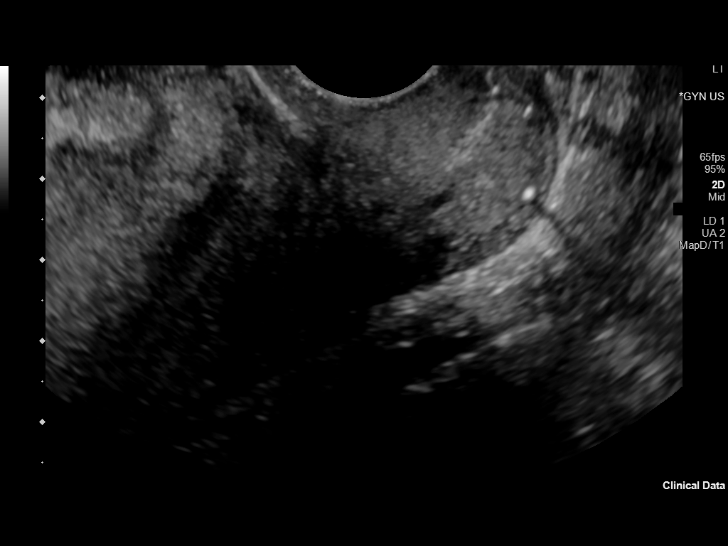
[im 102/211]
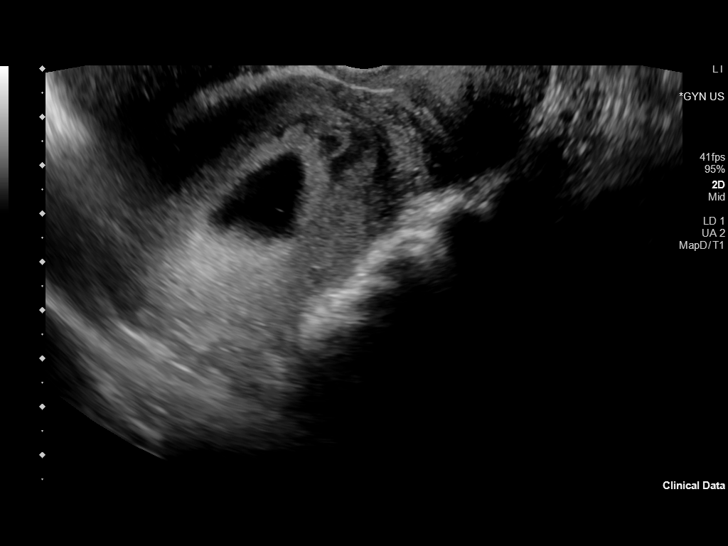
[im 117/211]
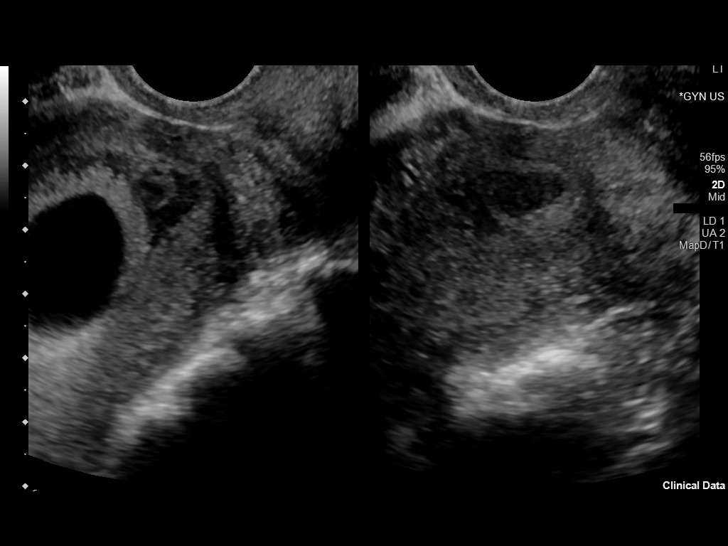
[im 133/211]
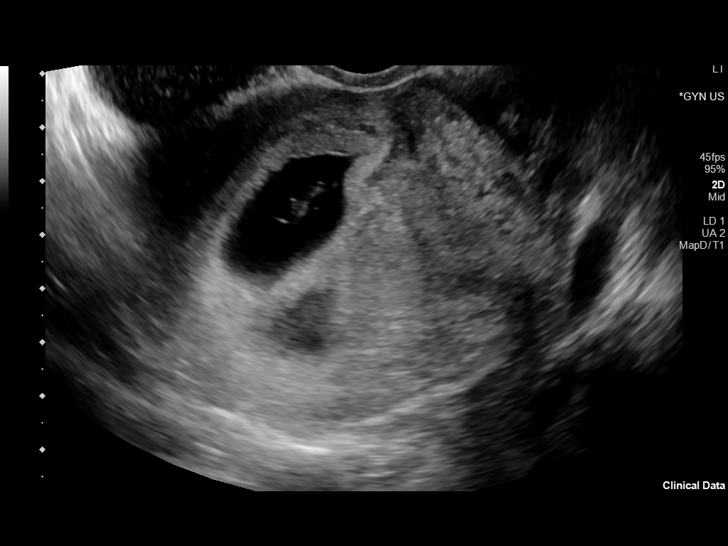
[im 148/211]
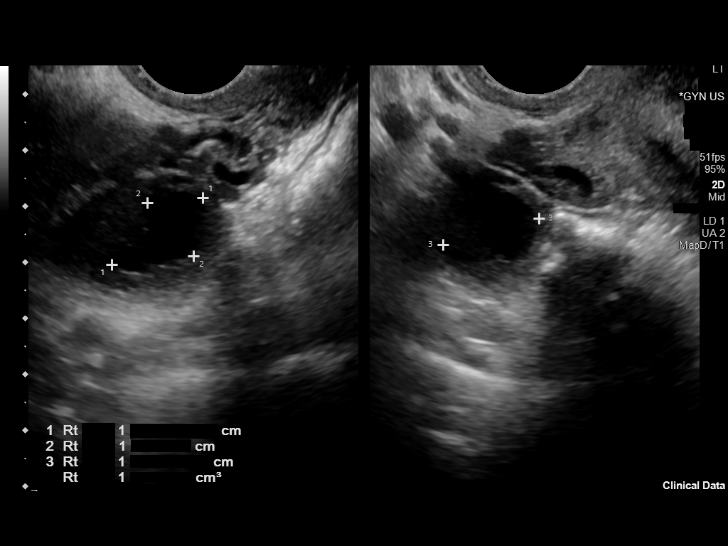
[im 164/211]
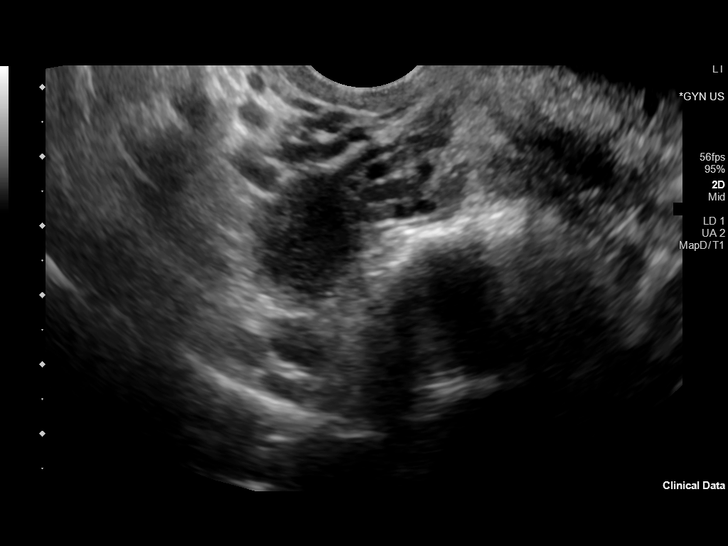
[im 179/211]
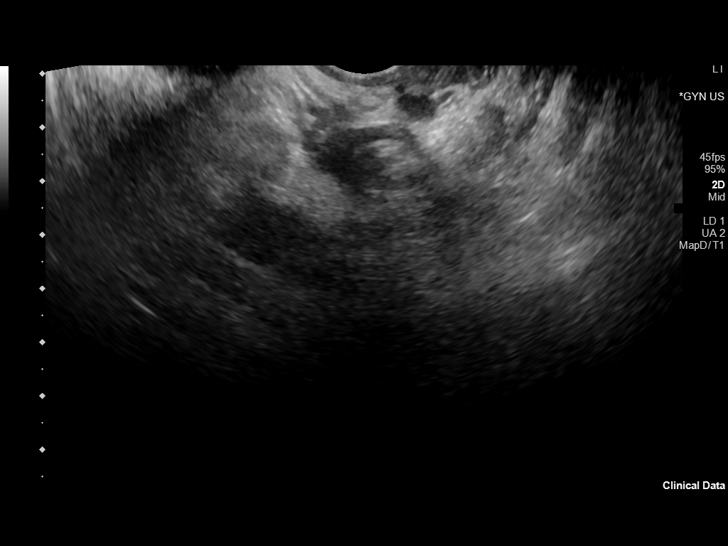
[im 195/211]
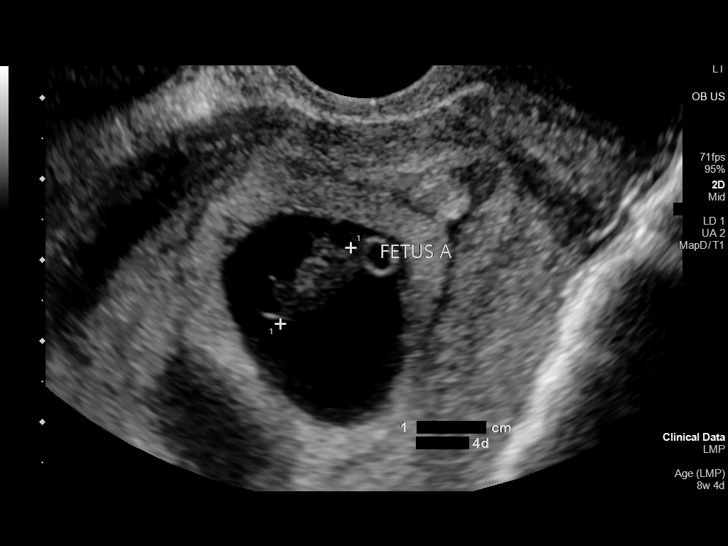
[im 211/211]
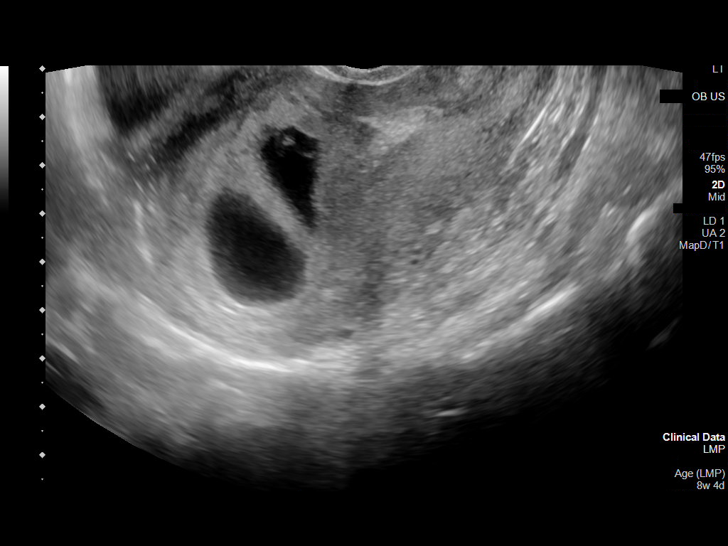

[14 of 28 positions shown; findings below may reference images not displayed]

FINDINGS: Number of IUPs:  2

Chorionicity/Amnionicity:  Dichorionic-diamniotic (thick membrane)

TWIN 1

Yolk sac:  Visualized.

Embryo:  Visualized.

Cardiac Activity: Visualized.

Heart Rate: 170 bpm

CRL:   12.8 mm   7 w 4 d                  US EDC: 12/24/2021

TWIN 2

Yolk sac:  Visualized.

Embryo:  Visualized.

Cardiac Activity: Visualized.

Heart Rate: 172 bpm

CRL:   13.6 mm   7 w 4 d                  US EDC: 12/24/2021

Subchorionic hemorrhage:  Trace subchorionic hemorrhage

Maternal uterus/adnexae: Right ovary is normal. Left ovary not
visualized. No free fluid in the cul-de-sac.
IMPRESSION: Live twin intrauterine gestations with approximate gestational age
of 7 weeks and 4 days. EDC based on today's sonogram is 12/24/2021.

## 2023-05-25 IMAGING — US US OB COMP LESS 14 WK
1 series · 14 of 28 positions shown · non-contrast
Comparison: None.

CLINICAL DATA: Pelvic pain

EXAM:
TWIN OBSTETRICAL ULTRASOUND <14 WKS
TECHNIQUE: Transabdominal ultrasound was performed for evaluation of the
gestation as well as the maternal uterus and adnexal regions.

[Series 1: us ob comp less 14 wk · 211 acquisitions, 14 frames shown]
[im 8/211]
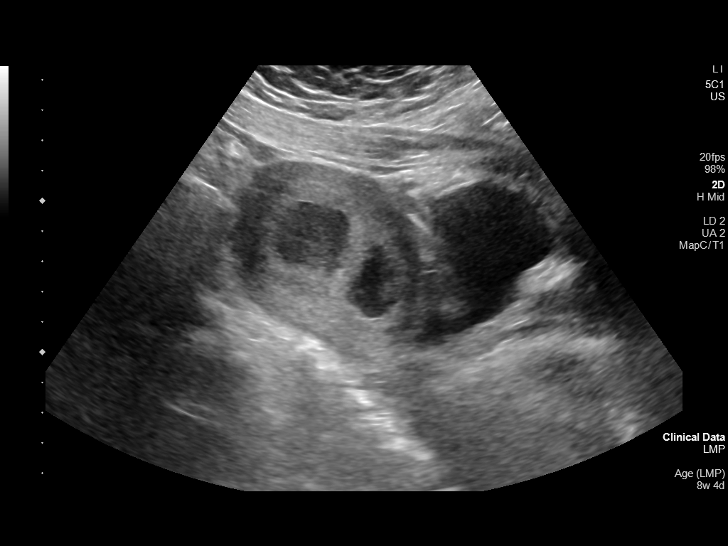
[im 24/211]
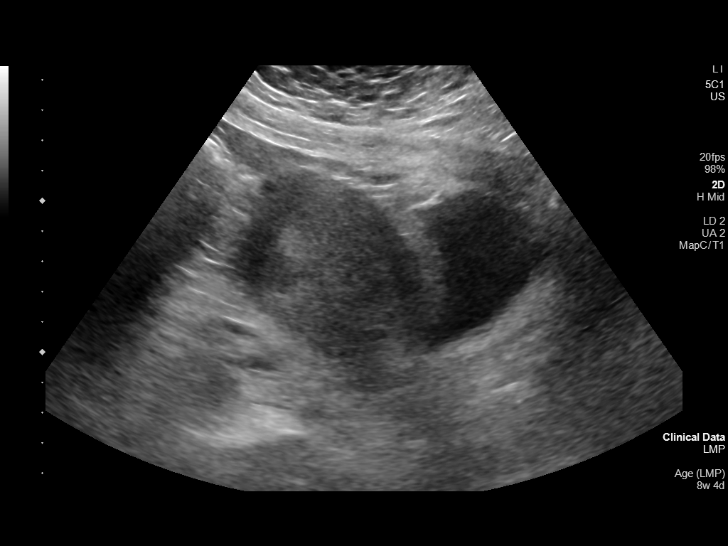
[im 39/211]
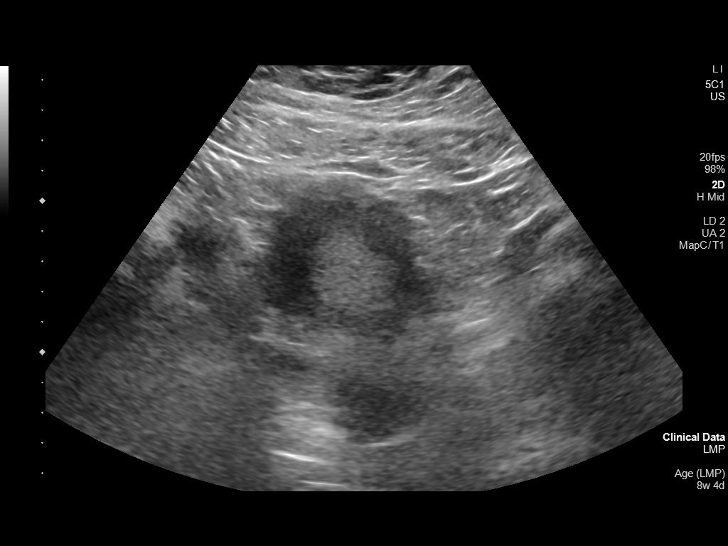
[im 55/211]
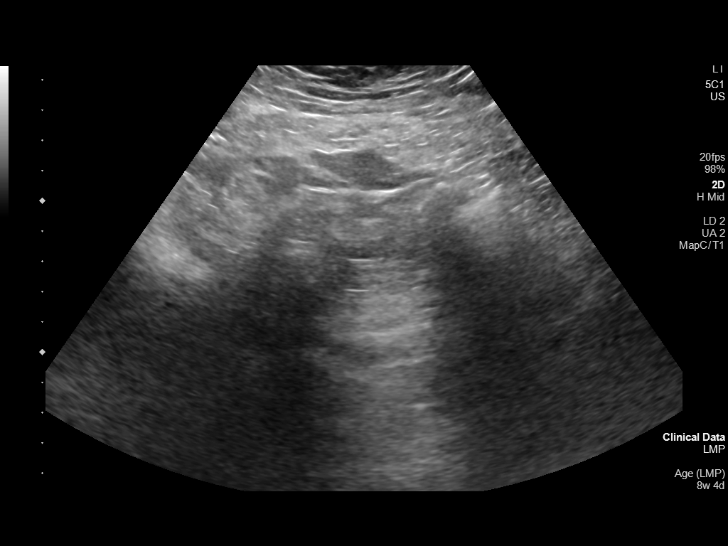
[im 71/211]
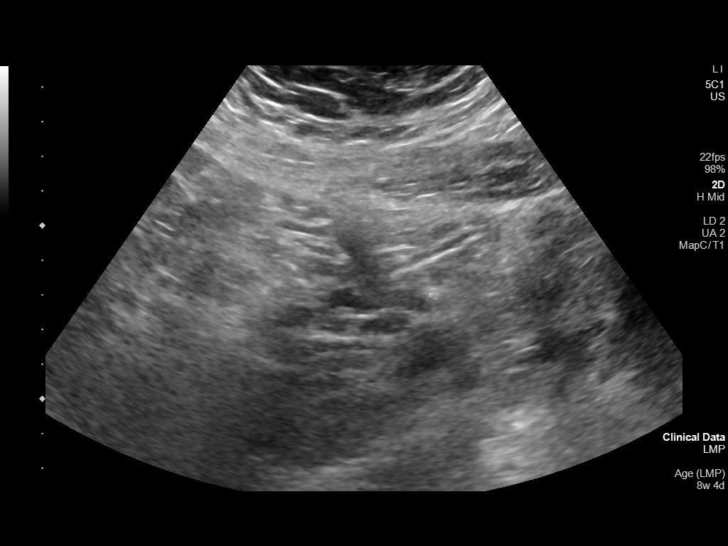
[im 86/211]
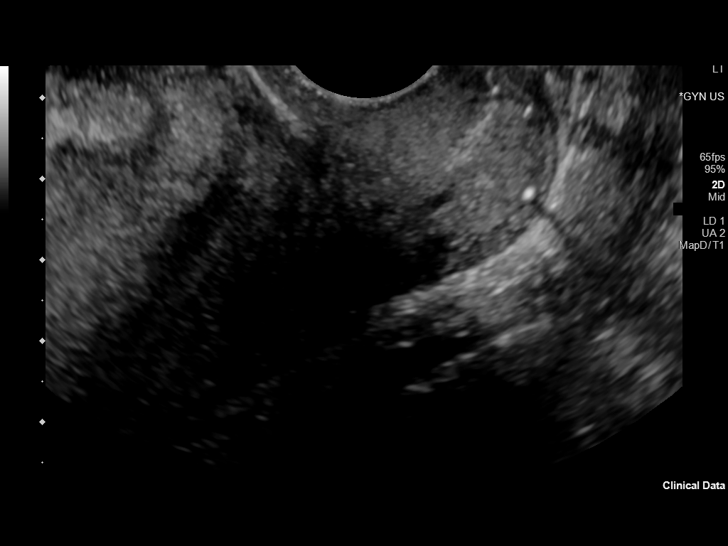
[im 102/211]
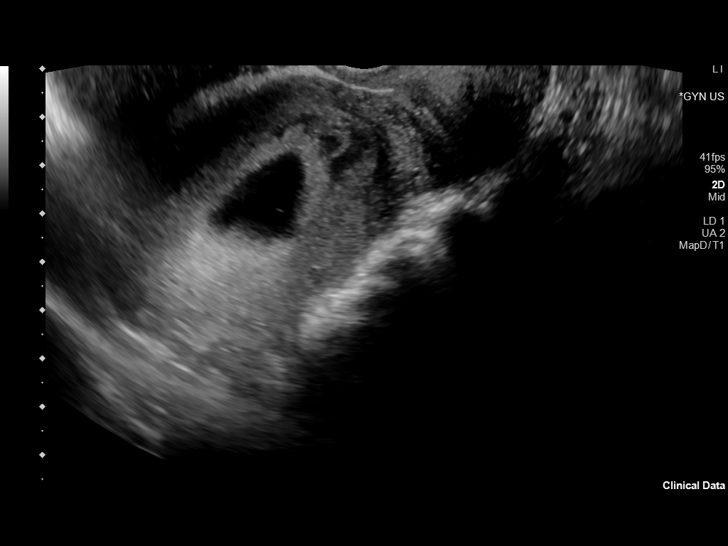
[im 117/211]
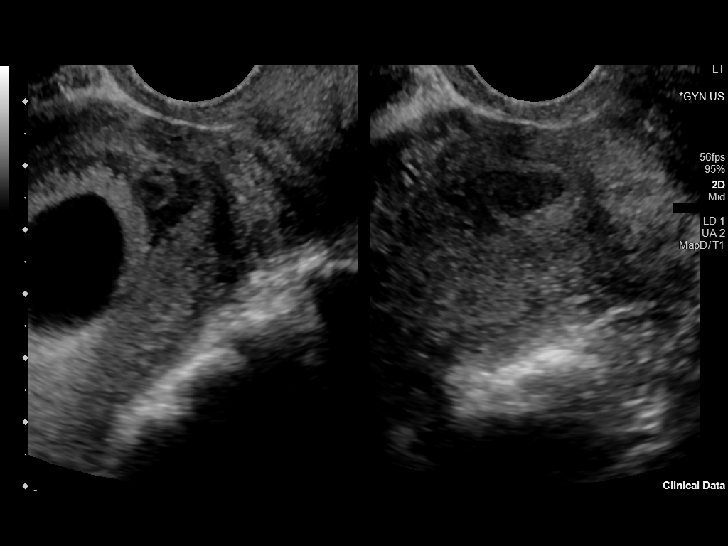
[im 133/211]
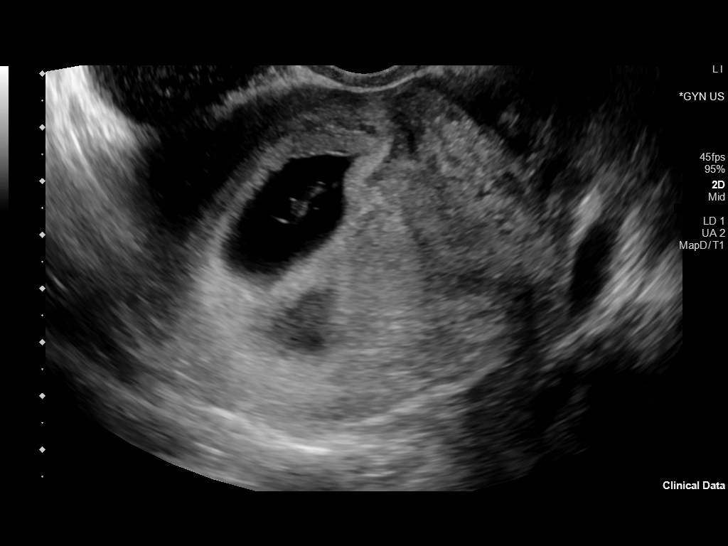
[im 148/211]
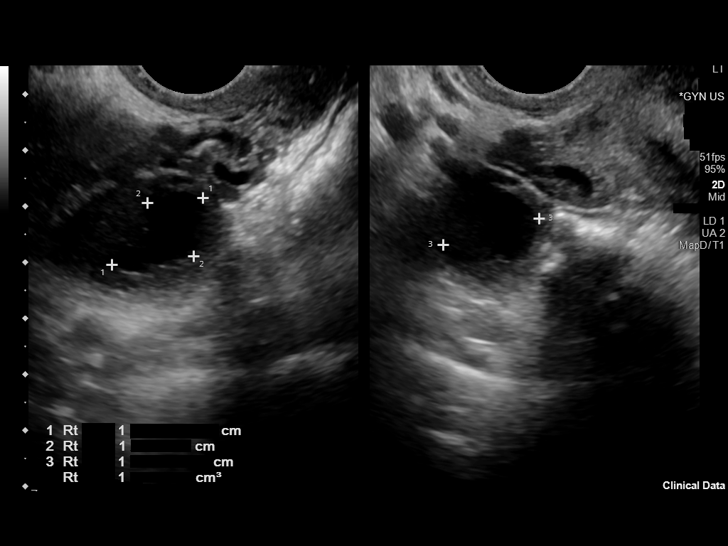
[im 164/211]
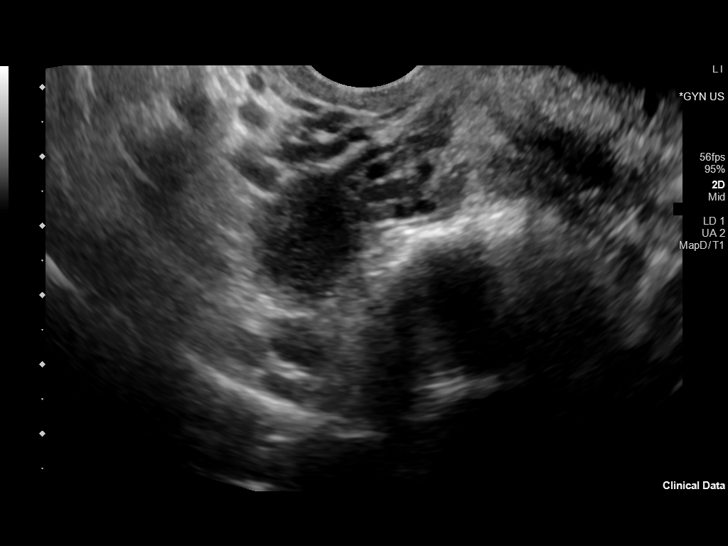
[im 179/211]
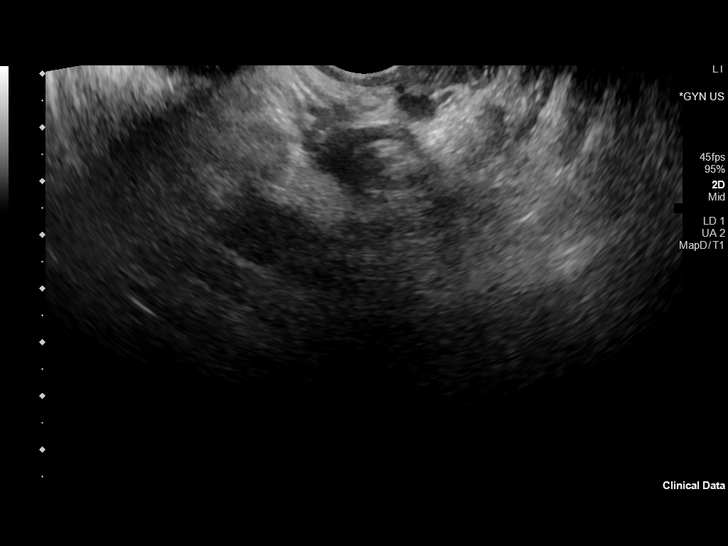
[im 195/211]
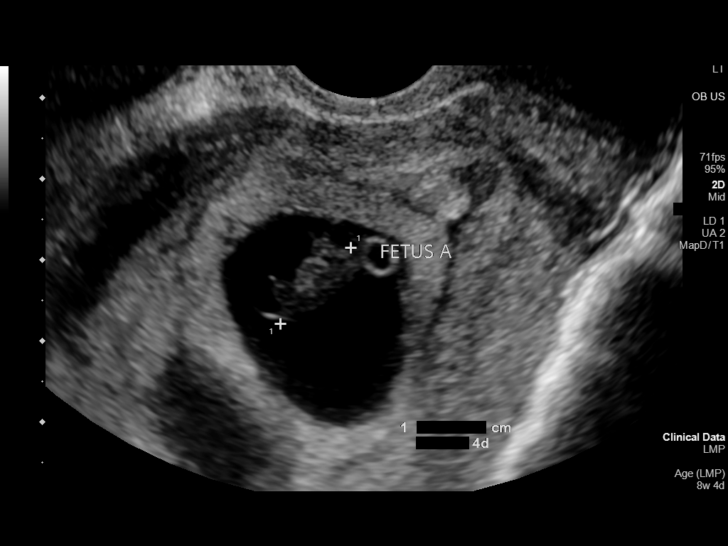
[im 211/211]
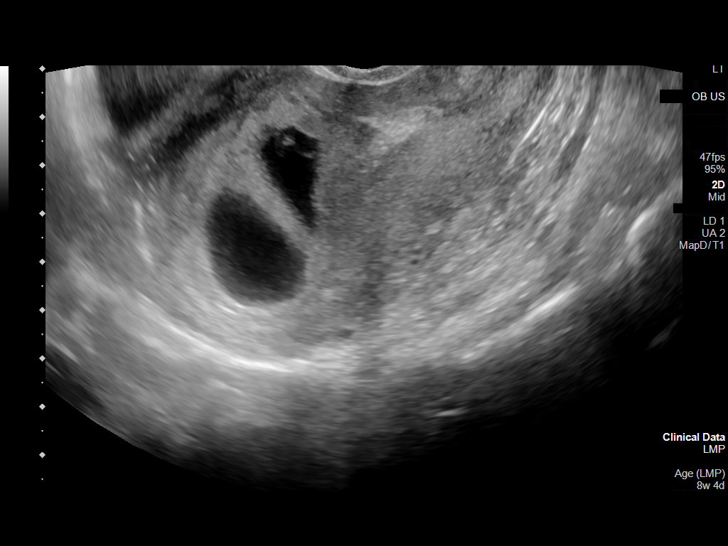

[14 of 28 positions shown; findings below may reference images not displayed]

FINDINGS: Number of IUPs:  2

Chorionicity/Amnionicity:  Dichorionic-diamniotic (thick membrane)

TWIN 1

Yolk sac:  Visualized.

Embryo:  Visualized.

Cardiac Activity: Visualized.

Heart Rate: 170 bpm

CRL:   12.8 mm   7 w 4 d                  US EDC: 12/24/2021

TWIN 2

Yolk sac:  Visualized.

Embryo:  Visualized.

Cardiac Activity: Visualized.

Heart Rate: 172 bpm

CRL:   13.6 mm   7 w 4 d                  US EDC: 12/24/2021

Subchorionic hemorrhage:  Trace subchorionic hemorrhage

Maternal uterus/adnexae: Right ovary is normal. Left ovary not
visualized. No free fluid in the cul-de-sac.
IMPRESSION: Live twin intrauterine gestations with approximate gestational age
of 7 weeks and 4 days. EDC based on today's sonogram is 12/24/2021.

## 2023-05-29 ENCOUNTER — Other Ambulatory Visit: Payer: Self-pay

## 2023-06-27 ENCOUNTER — Other Ambulatory Visit: Payer: Self-pay

## 2023-06-27 ENCOUNTER — Ambulatory Visit: Payer: Medicaid Other | Admitting: *Deleted

## 2023-06-27 VITALS — BP 133/78 | HR 86 | Ht 60.0 in | Wt 240.6 lb

## 2023-06-27 DIAGNOSIS — Z3042 Encounter for surveillance of injectable contraceptive: Secondary | ICD-10-CM

## 2023-06-27 MED ORDER — MEDROXYPROGESTERONE ACETATE 150 MG/ML IM SUSP
150.0000 mg | Freq: Once | INTRAMUSCULAR | Status: AC
  Administered 2023-06-27: 150 mg via INTRAMUSCULAR

## 2023-06-27 NOTE — Progress Notes (Signed)
Depo Provera 150 mg Im administered as scheduled.  Pt tolerated well. Next injection due 5/7-5/21. Next Annual Gyn exam due after 5/21.

## 2023-09-27 ENCOUNTER — Other Ambulatory Visit (HOSPITAL_COMMUNITY)
Admission: RE | Admit: 2023-09-27 | Discharge: 2023-09-27 | Disposition: A | Discharge status: Home / Self Care | Source: Ambulatory Visit | Attending: Advanced Practice Midwife | Admitting: Advanced Practice Midwife

## 2023-09-27 ENCOUNTER — Ambulatory Visit: Admitting: Advanced Practice Midwife

## 2023-09-27 ENCOUNTER — Encounter: Payer: Self-pay | Admitting: Advanced Practice Midwife

## 2023-09-27 VITALS — BP 134/90 | HR 104 | Wt 248.9 lb

## 2023-09-27 DIAGNOSIS — J302 Other seasonal allergic rhinitis: Secondary | ICD-10-CM

## 2023-09-27 DIAGNOSIS — Z603 Acculturation difficulty: Secondary | ICD-10-CM

## 2023-09-27 DIAGNOSIS — Z01419 Encounter for gynecological examination (general) (routine) without abnormal findings: Secondary | ICD-10-CM

## 2023-09-27 DIAGNOSIS — Z113 Encounter for screening for infections with a predominantly sexual mode of transmission: Secondary | ICD-10-CM

## 2023-09-27 DIAGNOSIS — Z3042 Encounter for surveillance of injectable contraceptive: Secondary | ICD-10-CM | POA: Diagnosis not present

## 2023-09-27 DIAGNOSIS — Z758 Other problems related to medical facilities and other health care: Secondary | ICD-10-CM | POA: Diagnosis not present

## 2023-09-27 DIAGNOSIS — Z114 Encounter for screening for human immunodeficiency virus [HIV]: Secondary | ICD-10-CM

## 2023-09-27 DIAGNOSIS — Z3009 Encounter for other general counseling and advice on contraception: Secondary | ICD-10-CM

## 2023-09-27 DIAGNOSIS — Z124 Encounter for screening for malignant neoplasm of cervix: Secondary | ICD-10-CM | POA: Insufficient documentation

## 2023-09-27 MED ORDER — MEDROXYPROGESTERONE ACETATE 150 MG/ML IM SUSP
150.0000 mg | Freq: Once | INTRAMUSCULAR | Status: AC
  Administered 2023-09-27: 150 mg via INTRAMUSCULAR

## 2023-09-27 MED ORDER — MONTELUKAST SODIUM 10 MG PO TABS: 10.0000 mg | ORAL_TABLET | Freq: Every day | ORAL | 6 refills | Status: DC

## 2023-09-27 NOTE — Patient Instructions (Signed)

## 2023-09-27 NOTE — Progress Notes (Signed)
 GYNECOLOGY ANNUAL PREVENTATIVE CARE ENCOUNTER NOTE  History:      Virginia Pearson is a 28 y.o. [phone] female here for a routine annual gynecologic exam.  Current complaints: Wants to discuss sterilization. Very certain that she is finished having kids. Also requesting other options for seasonal allergy sx. Flonase, OTC PO meds not working. This is a long term problem. Requests Pap and STI testing.   Denies abnormal vaginal bleeding, discharge, pelvic pain, problems with intercourse or other gynecologic concerns.    Gynecologic History No LMP recorded. Patient has had an injection. Contraception: Depo-Provera  injections Last Pap: 2022. Results were: normal with negative HPV Last mammogram: NA. Results were: NA  Obstetric History OB History  Gravida Para Term Preterm AB Living  3 3 1 2  4   SAB IAB Ectopic Multiple Live Births     1 4    # Outcome Date GA Lbr Len/2nd Weight Sex Type Anes PTL Lv  3A Preterm 11/07/21 [redacted]w[redacted]d  4 lb 6.9 oz (2.01 kg) F CS-LTranv Spinal  LIV     Birth Comments: preterm  3B Preterm 11/07/21 [redacted]w[redacted]d  5 lb 0.4 oz (2.28 kg) F CS-LTranv Spinal  LIV  2 Term 02/26/19 [redacted]w[redacted]d 29:20 / 00:01 7 lb 13.8 oz (3.566 kg) M Vag-Spont None  LIV  1 Preterm 11/26/15 [redacted]w[redacted]d  6 lb 8 oz (2.948 kg)  Vag-Spont   LIV    Past Medical History:  Diagnosis Date   Chest pain    Gestational diabetes    Headache    Kidney infection    SOB (shortness of breath)    Tachycardia     Past Surgical History:  Procedure Laterality Date   CESAREAN SECTION MULTI-GESTATIONAL N/A 11/07/2021   Procedure: CESAREAN SECTION MULTI-GESTATIONAL;  Surgeon: Teena Feast, MD;  Location: [facility];  Service: Obstetrics;  Laterality: N/A;   NO PAST SURGERIES      Current Outpatient Medications on File Prior to Visit  Medication Sig Dispense Refill   medroxyPROGESTERone  (DEPO-PROVERA ) 150 MG/ML injection Inject 150 mg into the muscle every 3 (three) months. Last injection 07/27/22      metFORMIN  (GLUCOPHAGE ) 500 MG tablet Take 1 tablet (500 mg total) by mouth 2 (two) times daily with a meal. (Patient not taking: Reported on 09/27/2023) 60 tablet 5   ondansetron  (ZOFRAN -ODT) 4 MG disintegrating tablet Take 1 tablet (4 mg total) by mouth every 8 (eight) hours as needed for nausea or vomiting. (Patient not taking: Reported on 04/11/2023) 12 tablet 0   No current facility-administered medications on file prior to visit.    No Known Allergies  Social History:  reports that she has never smoked. She has never used smokeless tobacco. She reports that she does not drink alcohol and does not use drugs.  Family History  Problem Relation Age of Onset   Diabetes Mother    Healthy Father    Diabetes Maternal Aunt    Diabetes Maternal Uncle    Diabetes Maternal Grandmother     The following portions of the patient's history were reviewed and updated as appropriate: allergies, current medications, past family history, past medical history, past social history, past surgical history and problem list.  Review of Systems Review of Systems  Constitutional:  Negative for chills and fever.  HENT:  Positive for congestion, postnasal drip and rhinorrhea. Negative for sinus pressure.   Respiratory:  Negative for cough and wheezing.   Gastrointestinal:  Negative for abdominal pain, nausea and vomiting.  Genitourinary:  Negative for difficulty urinating, dyspareunia, dysuria, frequency, genital sores, hematuria, menstrual problem, pelvic pain, urgency, vaginal bleeding and vaginal discharge.  Allergic/Immunologic: Positive for environmental allergies.  Psychiatric/Behavioral:         Reports struggling to take care of 4 small children.      Physical Exam:  BP (!) 134/90   Pulse (!) 104   Wt 248 lb 14.4 oz (112.9 kg)   Breastfeeding No   BMI 48.61 kg/m  CONSTITUTIONAL: Well-developed, obese, well-nourished female in no acute distress.  HENT:  Normocephalic, atraumatic. Oropharynx is  clear and moist EYES: Conjunctivae normal. No scleral icterus.  SKIN: Skin is warm and dry. No rash noted. Not diaphoretic. No erythema. No pallor. MUSCULOSKELETAL: Normal range of motion. No tenderness.  No cyanosis or edema.   NEUROLOGIC: Alert and oriented to person, place, and time. Normal muscle tone coordination.  PSYCHIATRIC: depressed mood and affect. Normal behavior. Normal judgment and thought content. CARDIOVASCULAR: Normal heart rate noted. RESPIRATORY: Effort and rate normal. BREASTS: Declined ABDOMEN: Soft, no distention, tenderness, rebound or guarding.  PELVIC: Normal appearing external genitalia; normal appearing vaginal mucosa and cervix.  No abnormal discharge noted.  Pap smear obtained.   Assessment and Plan:    1. Cervical cancer screening (Primary) - HIV Antibody (routine testing w rflx) - Hepatitis B surface antigen - RPR - Hepatitis C Antibody - Cytology - PAP( [place])  2. Routine screening for STI (sexually transmitted infection) - HIV Antibody (routine testing w rflx) - Hepatitis B surface antigen - RPR - Hepatitis C Antibody - Cytology - PAP( [place])  3. Encounter for screening for HIV - HIV Antibody (routine testing w rflx)  4. Birth control counseling - medroxyPROGESTERone  (DEPO-PROVERA ) injection 150 mg  5. Seasonal allergies - montelukast (SINGULAIR) 10 MG tablet; Take 1 tablet (10 mg total) by mouth at bedtime.  Dispense: 30 tablet; Refill: 6  6. Unwanted fertility - Counseled pt that sterilization should be considered permanent. Although there is a reversal surgery, it may not be effective, is costly and there is high risk of ectopic pregnancy after. Discussed alternative highly effective methods of contraception including IUDs, Nexplanon, Depo-Provera  and female sterilization. Questions addressed. Pt wishes to proceed with BTS. BTS consult signed today. Will get Depo today to avoid unwanted pregnancy prior to surgery. Will schedule with  surgeon for BTS consultation and scheduling.   7. Language barrier affecting health care - Video and in-person Interpreters used.  Pt had difficulty communicating with and speaking loudly enough for video interpreter. Was able to use in-person interpreter for remainder of visit. Recommend in-person interpreter for future appts.   Future Appointments  Date Time Provider Department Center  10/03/2023  1:15 PM Kiki Pelton, MD [facility]   Will follow up results of pap smear and manage accordingly. Mammogram not indicated Routine preventative health maintenance measures emphasized. Please refer to After Visit Summary for other counseling recommendations.      Taquisha Phung  Felipe Horton, CNM Center for [employer], [facility]

## 2023-09-28 ENCOUNTER — Encounter: Payer: Self-pay | Admitting: Advanced Practice Midwife

## 2023-09-28 ENCOUNTER — Telehealth: Payer: Self-pay

## 2023-09-28 ENCOUNTER — Encounter: Payer: Self-pay | Admitting: *Deleted

## 2023-09-28 LAB — HEPATITIS B SURFACE ANTIGEN: Hepatitis B Surface Ag: NEGATIVE

## 2023-09-28 LAB — RPR: RPR Ser Ql: NONREACTIVE

## 2023-09-28 LAB — HEPATITIS C ANTIBODY: Hep C Virus Ab: NONREACTIVE

## 2023-09-28 LAB — HIV ANTIBODY (ROUTINE TESTING W REFLEX): HIV Screen 4th Generation wRfx: NONREACTIVE

## 2023-09-28 NOTE — Telephone Encounter (Signed)
 Pt called nurse line, unable to understand message.    Carolynne Citron, RN

## 2023-10-03 ENCOUNTER — Encounter: Payer: Self-pay | Admitting: Obstetrics and Gynecology

## 2023-10-03 ENCOUNTER — Other Ambulatory Visit: Payer: Self-pay

## 2023-10-03 ENCOUNTER — Ambulatory Visit: Payer: Self-pay | Admitting: Advanced Practice Midwife

## 2023-10-03 ENCOUNTER — Ambulatory Visit: Admitting: Obstetrics and Gynecology

## 2023-10-03 VITALS — BP 134/66 | HR 72 | Wt 249.2 lb

## 2023-10-03 DIAGNOSIS — Z3009 Encounter for other general counseling and advice on contraception: Secondary | ICD-10-CM | POA: Diagnosis not present

## 2023-10-03 DIAGNOSIS — Z758 Other problems related to medical facilities and other health care: Secondary | ICD-10-CM | POA: Diagnosis not present

## 2023-10-03 DIAGNOSIS — Z603 Acculturation difficulty: Secondary | ICD-10-CM | POA: Diagnosis not present

## 2023-10-03 NOTE — Progress Notes (Signed)
    GYNECOLOGY VISIT  Patient name: Virginia Pearson MRN [id number]  Date of birth: [birth year] Chief Complaint:   BTL Consulation  History:  Virginia Pearson is a 28 y.o. [phone] being seen today for sterilization discussion.    States she walks a lot of work and concerned about return to work ok with 1 week for return to work.   Wants post instructions to involve pelic rest x 2 weeks.   No issues with pain medications; uses some meds for migraines   Past Medical History:  Diagnosis Date   Chest pain    Gestational diabetes    Headache    Kidney infection    SOB (shortness of breath)    Tachycardia    Type 2 diabetes mellitus (HCC) 09/27/2022    Past Surgical History:  Procedure Laterality Date   CESAREAN SECTION MULTI-GESTATIONAL N/A 11/07/2021   Procedure: CESAREAN SECTION MULTI-GESTATIONAL;  Surgeon: Teena Feast, MD;  Location: [facility] LD [facility]S;  Service: Obstetrics;  Laterality: N/A;    The following portions of the patient's history were reviewed and updated as appropriate: allergies, current medications, past family history, past medical history, past social history, past surgical history and problem list.   Health Maintenance:   Last pap 09/2020 NILM Last mammogram: n/a   Review of Systems:  Pertinent items are noted in HPI. Comprehensive review of systems was otherwise negative.   Objective:  Physical Exam BP 134/66   Pulse 72   Wt 249 lb 3.2 oz (113 kg)   BMI 48.67 kg/m    Physical Exam Vitals and nursing note reviewed.  Constitutional:      Appearance: Normal appearance.  HENT:     Head: Normocephalic and atraumatic.  Pulmonary:     Effort: Pulmonary effort is normal.  Skin:    General: Skin is warm and dry.  Neurological:     General: No focal deficit present.     Mental Status: She is alert.  Psychiatric:        Mood and Affect: Mood normal.        Behavior: Behavior normal.        Thought Content: Thought content normal.         Judgment: Judgment normal.        Assessment & Plan:   1. Unwanted fertility (Primary) She desires permanent sterilization. Discussed alternatives including LARC options and vasectomy. She declines these options. Discussed surgery of salpingectomy vs tubal ligation. She would like to do a salpingectomy.  Risks of surgery include but are not limited to: bleeding, infection, injury to surrounding organs/tissues (i.e. bowel/bladder/ureters), need for additional procedures, wound complications, hospital re-admission, regret,  conversion to open surgery, and VTE.  Reviewed restrictions and recovery following surgery  Noted that menses are not changed by TL but that there may be changes to menses as she transitions away from depo   - Ambulatory Referral For Surgery Scheduling  2. Language barrier In person spanish interpreter  Routine preventative health maintenance measures emphasized.  Kiki Pelton, MD Minimally Invasive Gynecologic Surgery Center for [facility], [facility]

## 2023-10-04 LAB — CYTOLOGY - PAP
Chlamydia: NEGATIVE
Comment: NEGATIVE
Comment: NEGATIVE
Comment: NORMAL
Diagnosis: NEGATIVE
Neisseria Gonorrhea: NEGATIVE
Trichomonas: NEGATIVE

## 2023-10-04 NOTE — Telephone Encounter (Signed)
 Called pt with interpreter Eda Royal. Pt stated her message from last week was just to let us  know she would be late for her appt.  Pt had no question or concern @ present

## 2023-10-08 ENCOUNTER — Emergency Department (HOSPITAL_COMMUNITY)
Admission: EM | Admit: 2023-10-08 | Discharge: 2023-10-09 | Discharge status: Against Medical Advice | Attending: Emergency Medicine | Admitting: Emergency Medicine

## 2023-10-08 ENCOUNTER — Other Ambulatory Visit: Payer: Self-pay

## 2023-10-08 DIAGNOSIS — Z5321 Procedure and treatment not carried out due to patient leaving prior to being seen by health care provider: Secondary | ICD-10-CM | POA: Diagnosis not present

## 2023-10-08 DIAGNOSIS — G43909 Migraine, unspecified, not intractable, without status migrainosus: Secondary | ICD-10-CM | POA: Insufficient documentation

## 2023-10-08 NOTE — ED Triage Notes (Signed)
 Pt reports chronic migraines and reports taking ibuprofen  and reports feeling better. Pt reports the migraine comes back soon after meds wear off.

## 2023-10-09 NOTE — ED Notes (Signed)
 Patient stated she wants to leave AMA. This Clinical research associate took her sticker , Triage nurse aware.

## 2023-10-22 ENCOUNTER — Telehealth: Payer: Self-pay

## 2023-10-22 NOTE — Telephone Encounter (Signed)
 Patient notified her GYN-OB office that her insurance was being terminated in office. Patient requested to have her surgery scheduled with Dr. Elester Grim as soon as possible. I left a vmail letting her know I can add her on 7/15/[facility] at 3 pm. I asked her to call me at [phone].

## 2023-10-22 NOTE — Telephone Encounter (Signed)
 Patient notified her GYN-OB office that her insurance was being terminated in August. Patient requested to have her surgery scheduled with Dr. Elester Grim as soon as possible. I left a vmail letting her know I can add her on 7/15/[facility] at 3 pm. I asked her to call me at [phone].

## 2023-10-23 ENCOUNTER — Telehealth: Payer: Self-pay

## 2023-10-23 NOTE — Telephone Encounter (Signed)
 Patient called me back to confirm her 11/20/23 surgery details. Pre-Op instructions were provided by phone. Patient confirmed understanding.

## 2023-11-16 ENCOUNTER — Encounter (HOSPITAL_COMMUNITY): Payer: Self-pay | Admitting: Obstetrics and Gynecology

## 2023-11-16 NOTE — Progress Notes (Addendum)
 Spoke w/ via phone for pre-op interview--- Virginia Pearson. [employer] interpreter [place] ID# [id number]. Lab needs dos----  CBC and T&S per surgeon. UPT, A1C and CBG per anesthesia.       Lab results------ COVID test -----patient states asymptomatic no test needed Arrive at -------1315 NPO after MN NO Solid Food.  Clear liquids from MN until---1215 Pre-Surgery Ensure or G2:  Med rec completed Medications to take morning of surgery -----NONE Diabetic medication -----NONE  GLP1 agonist last dose: GLP1 instructions:  Patient instructed no nail polish to be worn day of surgery Patient instructed to bring photo id and insurance card day of surgery Patient aware to have Driver (ride ) / caregiver    for 24 hours after surgery - Partner Virginia Pearson Patient Special Instructions ----- Shower with antibacterial soap. Pre-Op special Instructions ----- Interpreter requested.  Patient verbalized understanding of instructions that were given at this phone interview. Patient denies chest pain, sob, fever, cough at the interview.

## 2023-11-20 ENCOUNTER — Other Ambulatory Visit: Payer: Self-pay

## 2023-11-20 ENCOUNTER — Ambulatory Visit (HOSPITAL_COMMUNITY)
Admission: RE | Admit: 2023-11-20 | Discharge: 2023-11-20 | Disposition: A | Discharge status: Home / Self Care | Attending: Obstetrics and Gynecology | Admitting: Obstetrics and Gynecology

## 2023-11-20 ENCOUNTER — Ambulatory Visit (HOSPITAL_COMMUNITY): Payer: Self-pay | Admitting: Anesthesiology

## 2023-11-20 ENCOUNTER — Encounter (HOSPITAL_COMMUNITY): Payer: Self-pay | Admitting: Obstetrics and Gynecology

## 2023-11-20 ENCOUNTER — Encounter (HOSPITAL_COMMUNITY)
Admission: RE | Disposition: A | Discharge status: Home / Self Care | Payer: Self-pay | Source: Home / Self Care | Attending: Obstetrics and Gynecology

## 2023-11-20 DIAGNOSIS — E119 Type 2 diabetes mellitus without complications: Secondary | ICD-10-CM | POA: Diagnosis not present

## 2023-11-20 DIAGNOSIS — Z302 Encounter for sterilization: Secondary | ICD-10-CM

## 2023-11-20 DIAGNOSIS — O3422 Maternal care for cesarean scar defect (isthmocele): Secondary | ICD-10-CM

## 2023-11-20 DIAGNOSIS — Z7984 Long term (current) use of oral hypoglycemic drugs: Secondary | ICD-10-CM | POA: Insufficient documentation

## 2023-11-20 DIAGNOSIS — E66813 Obesity, class 3: Secondary | ICD-10-CM | POA: Insufficient documentation

## 2023-11-20 DIAGNOSIS — Z3009 Encounter for other general counseling and advice on contraception: Secondary | ICD-10-CM

## 2023-11-20 DIAGNOSIS — Z6841 Body Mass Index (BMI) 40.0 and over, adult: Secondary | ICD-10-CM | POA: Insufficient documentation

## 2023-11-20 DIAGNOSIS — Z01818 Encounter for other preprocedural examination: Secondary | ICD-10-CM

## 2023-11-20 DIAGNOSIS — I10 Essential (primary) hypertension: Secondary | ICD-10-CM

## 2023-11-20 DIAGNOSIS — N808 Other endometriosis: Secondary | ICD-10-CM | POA: Diagnosis not present

## 2023-11-20 HISTORY — PX: LAPAROSCOPIC BILATERAL SALPINGECTOMY: SHX5889

## 2023-11-20 LAB — GLUCOSE, CAPILLARY
Glucose-Capillary: 107 mg/dL — ABNORMAL HIGH (ref 70–99)
Glucose-Capillary: 86 mg/dL (ref 70–99)
Glucose-Capillary: 80 mg/dL (ref 70–99)
Glucose-Capillary: 125 mg/dL — ABNORMAL HIGH (ref 70–99)

## 2023-11-20 LAB — POCT PREGNANCY, URINE: Preg Test, Ur: NEGATIVE

## 2023-11-20 LAB — HEMOGLOBIN A1C
Hgb A1c MFr Bld: 6.6 % — ABNORMAL HIGH (ref 4.8–5.6)
Mean Plasma Glucose: 142.72 mg/dL

## 2023-11-20 LAB — TYPE AND SCREEN
ABO/RH(D): B POS
Antibody Screen: NEGATIVE

## 2023-11-20 SURGERY — SALPINGECTOMY, BILATERAL, LAPAROSCOPIC
Anesthesia: General | Site: Pelvis

## 2023-11-20 MED ORDER — FENTANYL CITRATE (PF) 250 MCG/5ML IJ SOLN
INTRAMUSCULAR | Status: AC
  Filled 2023-11-20: qty 5

## 2023-11-20 MED ORDER — LIDOCAINE 2% (20 MG/ML) 5 ML SYRINGE
INTRAMUSCULAR | Status: DC | PRN
Start: 2023-11-20 — End: 2023-11-23
  Administered 2023-11-20: 100 mg via INTRAVENOUS

## 2023-11-20 MED ORDER — ORAL CARE MOUTH RINSE: 15.0000 mL | Freq: Once | OROMUCOSAL | Status: AC

## 2023-11-20 MED ORDER — IBUPROFEN 600 MG PO TABS: 600.0000 mg | ORAL_TABLET | Freq: Four times a day (QID) | ORAL | 0 refills | Status: DC | PRN

## 2023-11-20 MED ORDER — MEPERIDINE HCL 25 MG/ML IJ SOLN: 6.2500 mg | INTRAMUSCULAR | Status: DC | PRN

## 2023-11-20 MED ORDER — CELECOXIB 200 MG PO CAPS
200.0000 mg | ORAL_CAPSULE | Freq: Once | ORAL | Status: AC
  Administered 2023-11-20: 200 mg via ORAL

## 2023-11-20 MED ORDER — ONDANSETRON HCL 4 MG/2ML IJ SOLN
INTRAMUSCULAR | Status: DC | PRN
  Administered 2023-11-20: 4 mg via INTRAVENOUS

## 2023-11-20 MED ORDER — POVIDONE-IODINE 10 % EX SWAB: 2.0000 | Freq: Once | CUTANEOUS | Status: DC

## 2023-11-20 MED ORDER — OXYCODONE HCL 5 MG/5ML PO SOLN: 5.0000 mg | Freq: Once | ORAL | Status: DC | PRN

## 2023-11-20 MED ORDER — SODIUM CHLORIDE 0.9 % IR SOLN
Status: DC | PRN
  Administered 2023-11-20: 1000 mL

## 2023-11-20 MED ORDER — GABAPENTIN 300 MG PO CAPS
ORAL_CAPSULE | ORAL | Status: AC
  Filled 2023-11-20: qty 1

## 2023-11-20 MED ORDER — ACETAMINOPHEN 500 MG PO TABS
1000.0000 mg | ORAL_TABLET | ORAL | Status: AC
Start: 2023-11-20 — End: 2023-11-20
  Administered 2023-11-20: 1000 mg via ORAL

## 2023-11-20 MED ORDER — FENTANYL CITRATE (PF) 250 MCG/5ML IJ SOLN
INTRAMUSCULAR | Status: DC | PRN
  Administered 2023-11-20: 100 ug via INTRAVENOUS
  Administered 2023-11-20 (×2): 50 ug via INTRAVENOUS

## 2023-11-20 MED ORDER — OXYCODONE HCL 5 MG PO TABS: 5.0000 mg | ORAL_TABLET | ORAL | 0 refills | Status: DC | PRN

## 2023-11-20 MED ORDER — DEXAMETHASONE SODIUM PHOSPHATE 10 MG/ML IJ SOLN
INTRAMUSCULAR | Status: DC | PRN
  Administered 2023-11-20: 10 mg via INTRAVENOUS

## 2023-11-20 MED ORDER — ROCURONIUM BROMIDE 10 MG/ML (PF) SYRINGE
PREFILLED_SYRINGE | INTRAVENOUS | Status: DC | PRN
Start: 2023-11-20 — End: 2023-11-23
  Administered 2023-11-20: 20 mg via INTRAVENOUS
  Administered 2023-11-20: 40 mg via INTRAVENOUS

## 2023-11-20 MED ORDER — LACTATED RINGERS IV SOLN: INTRAVENOUS | Status: DC

## 2023-11-20 MED ORDER — 0.9 % SODIUM CHLORIDE (POUR BTL) OPTIME
TOPICAL | Status: DC | PRN
  Administered 2023-11-20: 1000 mL

## 2023-11-20 MED ORDER — INSULIN ASPART 100 UNIT/ML IJ SOLN: 0.0000 [IU] | INTRAMUSCULAR | Status: DC | PRN

## 2023-11-20 MED ORDER — MIDAZOLAM HCL 2 MG/2ML IJ SOLN
INTRAMUSCULAR | Status: AC
  Filled 2023-11-20: qty 2

## 2023-11-20 MED ORDER — PROPOFOL 10 MG/ML IV BOLUS
INTRAVENOUS | Status: DC | PRN
  Administered 2023-11-20: 200 mg via INTRAVENOUS

## 2023-11-20 MED ORDER — OXYCODONE HCL 5 MG PO TABS: 5.0000 mg | ORAL_TABLET | Freq: Once | ORAL | Status: DC | PRN

## 2023-11-20 MED ORDER — MIDAZOLAM HCL 2 MG/2ML IJ SOLN
INTRAMUSCULAR | Status: DC | PRN
  Administered 2023-11-20: 2 mg via INTRAVENOUS

## 2023-11-20 MED ORDER — BUPIVACAINE HCL (PF) 0.5 % IJ SOLN
INTRAMUSCULAR | Status: AC
Start: 2023-11-20 — End: 2023-11-20
  Filled 2023-11-20: qty 30

## 2023-11-20 MED ORDER — CHLORHEXIDINE GLUCONATE 0.12 % MT SOLN
15.0000 mL | Freq: Once | OROMUCOSAL | Status: AC
Start: 2023-11-20 — End: 2023-11-20
  Administered 2023-11-20: 15 mL via OROMUCOSAL

## 2023-11-20 MED ORDER — SUGAMMADEX SODIUM 200 MG/2ML IV SOLN
INTRAVENOUS | Status: DC | PRN
  Administered 2023-11-20: 300 mg via INTRAVENOUS

## 2023-11-20 MED ORDER — ONDANSETRON HCL 4 MG/2ML IJ SOLN: 4.0000 mg | Freq: Once | INTRAMUSCULAR | Status: DC | PRN

## 2023-11-20 MED ORDER — CHLORHEXIDINE GLUCONATE 0.12 % MT SOLN
OROMUCOSAL | Status: AC
  Filled 2023-11-20: qty 15

## 2023-11-20 MED ORDER — FENTANYL CITRATE (PF) 100 MCG/2ML IJ SOLN: 25.0000 ug | INTRAMUSCULAR | Status: DC | PRN

## 2023-11-20 MED ORDER — HEMOSTATIC AGENTS (NO CHARGE) OPTIME
TOPICAL | Status: DC | PRN
  Administered 2023-11-20: 1

## 2023-11-20 MED ORDER — ACETAMINOPHEN 500 MG PO TABS
ORAL_TABLET | ORAL | Status: AC
  Filled 2023-11-20: qty 2

## 2023-11-20 MED ORDER — PROPOFOL 10 MG/ML IV BOLUS
INTRAVENOUS | Status: AC
  Filled 2023-11-20: qty 20

## 2023-11-20 MED ORDER — ACETAMINOPHEN 500 MG PO TABS: 500.0000 mg | ORAL_TABLET | Freq: Four times a day (QID) | ORAL | 0 refills | Status: DC | PRN

## 2023-11-20 MED ORDER — CELECOXIB 200 MG PO CAPS
ORAL_CAPSULE | ORAL | Status: AC
  Filled 2023-11-20: qty 1

## 2023-11-20 MED ORDER — GABAPENTIN 300 MG PO CAPS
300.0000 mg | ORAL_CAPSULE | ORAL | Status: AC
  Administered 2023-11-20: 300 mg via ORAL

## 2023-11-20 MED ORDER — BUPIVACAINE HCL (PF) 0.5 % IJ SOLN
INTRAMUSCULAR | Status: DC | PRN
Start: 2023-11-20 — End: 2023-11-20
  Administered 2023-11-20: 20 mL

## 2023-11-20 SURGICAL SUPPLY — 39 items
APPLICATOR ARISTA FLEXITIP XL (MISCELLANEOUS) IMPLANT
CNTNR URN SCR LID CUP LEK RST (MISCELLANEOUS) ×2 IMPLANT
COVER MAYO STAND STRL (DRAPES) ×2 IMPLANT
DERMABOND ADVANCED .7 DNX12 (GAUZE/BANDAGES/DRESSINGS) ×2 IMPLANT
DRAPE SURG IRRIG POUCH 19X23 (DRAPES) ×2 IMPLANT
DURAPREP 26ML APPLICATOR (WOUND CARE) ×2 IMPLANT
ELECTRODE REM PT RTRN 9FT ADLT (ELECTROSURGICAL) ×2 IMPLANT
GAUZE 4X4 16PLY ~~LOC~~+RFID DBL (SPONGE) IMPLANT
GLOVE BIOGEL PI IND STRL 7.0 (GLOVE) ×8 IMPLANT
GLOVE ECLIPSE 7.0 STRL STRAW (GLOVE) ×2 IMPLANT
GOWN STRL REUS W/ TWL LRG LVL3 (GOWN DISPOSABLE) ×2 IMPLANT
GOWN STRL REUS W/ TWL XL LVL3 (GOWN DISPOSABLE) ×4 IMPLANT
HEMOSTAT ARISTA ABSORB 3G PWDR (HEMOSTASIS) IMPLANT
IRRIGATION SUCT STRKRFLW 2 WTP (MISCELLANEOUS) IMPLANT
KIT PINK PAD W/HEAD ARM REST (MISCELLANEOUS) ×2 IMPLANT
KIT TURNOVER KIT B (KITS) ×2 IMPLANT
LIGASURE BLUNT TIP 5 LONG 44CM (ELECTROSURGICAL) IMPLANT
MANIFOLD NEPTUNE II (INSTRUMENTS) IMPLANT
NDL HYPO 22X1.5 SAFETY MO (MISCELLANEOUS) IMPLANT
NDL INSUFFLATION 14GA 120MM (NEEDLE) IMPLANT
NDL SPNL 22GX3.5 QUINCKE BK (NEEDLE) ×2 IMPLANT
NEEDLE HYPO 22X1.5 SAFETY MO (MISCELLANEOUS) ×2 IMPLANT
NEEDLE INSUFFLATION 14GA 120MM (NEEDLE) IMPLANT
NEEDLE SPNL 22GX3.5 QUINCKE BK (NEEDLE) ×2 IMPLANT
NS IRRIG 1000ML POUR BTL (IV SOLUTION) ×2 IMPLANT
PACK LAPAROSCOPY BASIN (CUSTOM PROCEDURE TRAY) ×2 IMPLANT
PAD OB MATERNITY 11 LF (PERSONAL CARE ITEMS) ×2 IMPLANT
POUCH LAPAROSCOPIC INSTRUMENT (MISCELLANEOUS) ×2 IMPLANT
SET TUBE SMOKE EVAC HIGH FLOW (TUBING) ×2 IMPLANT
SHEARS HARMONIC 36 ACE (MISCELLANEOUS) IMPLANT
SLEEVE SCD COMPRESS KNEE MED (STOCKING) ×2 IMPLANT
SLEEVE Z-THREAD 5X100MM (TROCAR) ×4 IMPLANT
SUT VIC AB 2-0 SH 27XBRD (SUTURE) IMPLANT
SUT VIC AB 4-0 PS2 18 (SUTURE) ×2 IMPLANT
SYSTEM BAG RETRIEVAL 10MM (BASKET) IMPLANT
TRAY FOLEY W/BAG SLVR 14FR LF (SET/KITS/TRAYS/PACK) ×2 IMPLANT
TROCAR KII 8X100ML NONTHREADED (TROCAR) IMPLANT
TROCAR Z-THREAD FIOS 5X100MM (TROCAR) ×2 IMPLANT
WARMER LAPAROSCOPE (MISCELLANEOUS) ×2 IMPLANT

## 2023-11-20 NOTE — H&P (Signed)
 OB/GYN Pre-Op History and Physical  Virginia Pearson is a 28 y.o. [phone] presenting for laparoscopic salpingectomy.       Past Medical History:  Diagnosis Date   Chest pain    ~ 2 years ago, none since per pt 11/20/23   Gestational diabetes    Headache    Kidney infection    SOB (shortness of breath)    Tachycardia    Type 2 diabetes mellitus (HCC) 09/27/2022    Past Surgical History:  Procedure Laterality Date   CESAREAN SECTION MULTI-GESTATIONAL N/A 11/07/2021   Procedure: CESAREAN SECTION MULTI-GESTATIONAL;  Surgeon: Lola Donnice HERO, MD;  Location: [facility] LD [facility]S;  Service: Obstetrics;  Laterality: N/A;    OB History  Gravida Para Term Preterm AB Living  3 3 1 2  4   SAB IAB Ectopic Multiple Live Births     1 4    # Outcome Date GA Lbr Len/2nd Weight Sex Type Anes PTL Lv  3A Preterm 11/07/21 [redacted]w[redacted]d  2010 g F CS-LTranv Spinal  LIV     Birth Comments: preterm  3B Preterm 11/07/21 [redacted]w[redacted]d  2280 g F CS-LTranv Spinal  LIV  2 Term 02/26/19 [redacted]w[redacted]d 29:20 / 00:01 3566 g M Vag-Spont None  LIV  1 Preterm 11/26/15 [redacted]w[redacted]d  2948 g  Vag-Spont   LIV    Social History   Socioeconomic History   Marital status: Single    Spouse name: Not on file   Number of children: Not on file   Years of education: Not on file   Highest education level: Not on file  Occupational History   Not on file  Tobacco Use   Smoking status: Never   Smokeless tobacco: Never  Vaping Use   Vaping status: Never Used  Substance and Sexual Activity   Alcohol use: No   Drug use: No   Sexual activity: Yes    Birth control/protection: Injection  Other Topics Concern   Not on file  Social History Narrative   Not on file   Social Drivers of Health   Financial Resource Strain: Not on File (08/31/2021)   Received from [employer]    Financial Resource Strain: 0  Food Insecurity: Food Insecurity Present (10/03/2023)   Hunger Vital Sign    Worried About Running Out of Food in the  Last Year: Sometimes true    Ran Out of Food in the Last Year: Sometimes true  Transportation Needs: No Transportation Needs (10/03/2023)   PRAPARE - Administrator, Civil Service (Medical): No    Lack of Transportation (Non-Medical): No  Physical Activity: Not on File (08/31/2021)   Received [facility]   Physical Activity    Physical Activity: 0  Stress: Not on File (08/31/2021)   Received from [facility]   Stress    Stress: 0  Social Connections: Not on File (01/20/2023)   Received from [facility]   Social Connections    Connectedness: 0    Family History  Problem Relation Age of Onset   Diabetes Mother    Healthy Father    Diabetes Maternal Aunt    Diabetes Maternal Uncle    Diabetes Maternal Grandmother     Medications Prior to Admission  Medication Sig Dispense Refill Last Dose/Taking   montelukast  (SINGULAIR ) 10 MG tablet Take 1 tablet (10 mg total) by mouth at bedtime. 30 tablet 6 Past Month   Multiple Vitamin (MULTIVITAMIN WITH MINERALS) TABS tablet Take 1 tablet by mouth daily.  Past Month   medroxyPROGESTERone  (DEPO-PROVERA ) 150 MG/ML injection Inject 150 mg into the muscle every 3 (three) months. Last injection 07/27/22   10/28/2023   metFORMIN  (GLUCOPHAGE ) 500 MG tablet Take 1 tablet (500 mg total) by mouth 2 (two) times daily with a meal. (Patient not taking: Reported on 10/03/2023) 60 tablet 5    ondansetron  (ZOFRAN -ODT) 4 MG disintegrating tablet Take 1 tablet (4 mg total) by mouth every 8 (eight) hours as needed for nausea or vomiting. (Patient not taking: Reported on 10/03/2023) 12 tablet 0     No Known Allergies  Review of Systems: Negative except for what is mentioned in HPI.     Physical Exam: BP 134/72   Pulse 78   Temp 98.8 F (37.1 C) (Oral)   Resp 16   Ht 5' (1.524 m)   Wt 108.9 kg   SpO2 97%   Breastfeeding No   BMI 46.87 kg/m  CONSTITUTIONAL: Well-developed, well-nourished and in no acute distress.  HENT:  Normocephalic, atraumatic,  External right and left ear normal. Oropharynx is clear and moist EYES: Conjunctivae and EOM are normal. Pupils are equal, round, and reactive to light. No scleral icterus.  NECK: Normal range of motion, supple, no masses SKIN: Skin is warm and dry. No rash noted. Not diaphoretic. No erythema. No pallor. NEUROLGIC: Alert and oriented to person, place, and time. Normal reflexes, muscle tone coordination. No cranial nerve deficit noted. PSYCHIATRIC: Normal mood and affect. Normal behavior. Normal judgment and thought content. RESPIRATORY: Normal effort PELVIC: Deferred   Pertinent Labs/Studies:   Results for orders placed or performed during the hospital encounter of 11/20/23 (from the past 72 hours)  Pregnancy, urine POC     Status: None   Collection Time: 11/20/23 10:28 AM  Result Value Ref Range   Preg Test, Ur NEGATIVE NEGATIVE    Comment:        THE SENSITIVITY OF THIS METHODOLOGY IS >20 mIU/mL.   Hemoglobin A1c per protocol     Status: Abnormal   Collection Time: 11/20/23 11:00 AM  Result Value Ref Range   Hgb A1c MFr Bld 6.6 (H) 4.8 - 5.6 %    Comment: (NOTE) Diagnosis of Diabetes The following HbA1c ranges recommended by the American Diabetes Association (ADA) may be used as an aid in the diagnosis of diabetes mellitus.  Hemoglobin             Suggested A1C NGSP%              Diagnosis  <5.7                   Non Diabetic  5.7-6.4                Pre-Diabetic  >6.4                   Diabetic  <7.0                   Glycemic control for                       adults with diabetes.     Mean Plasma Glucose 142.72 mg/dL    Comment: Performed at [facility], [address], [place], [place] [address]  Glucose, capillary     Status: Abnormal   Collection Time: 11/20/23 11:03 AM  Result Value Ref Range   Glucose-Capillary 107 (H) 70 - 99 mg/dL    Comment:  Glucose reference range applies only to samples taken after fasting for at least 8 hours.  Type and screen  [facility]     Status: None   Collection Time: 11/20/23 11:04 AM  Result Value Ref Range   ABO/RH(D) B POS    Antibody Screen NEG    Sample Expiration      11/23/2023,2359 Performed at [facility], [address], [place], [place] [address]   Glucose, capillary     Status: None   Collection Time: 11/20/23  1:17 PM  Result Value Ref Range   Glucose-Capillary 86 70 - 99 mg/dL    Comment: Glucose reference range applies only to samples taken after fasting for at least 8 hours.       Assessment and Plan :Virginia Pearson is a 28 y.o. [id number] here for sterilization   Patient desires surgical management with [facility].  The risks of surgery were discussed in detail with the patient including but not limited to: bleeding which may require transfusion or reoperation; infection which may require prolonged hospitalization or re-hospitalization and antibiotic therapy; injury to bowel, bladder, ureters and major vessels or other surrounding organs which may lead to other procedures; formation of adhesions; need for additional procedures including laparotomy or subsequent procedures secondary to intraoperative injury or abnormal pathology; thromboembolic phenomenon; incisional problems and other postoperative or anesthesia complications.  Patient was told that the likelihood that her condition and symptoms will be treated effectively with this surgical management was high; the postoperative expectations were also discussed in detail. The patient also understands the alternative treatment options which were discussed in full. All questions were answered.     Vernel Donlan, M.D. Minimally Invasive Gynecologic Surgery and Pelvic Pain Specialist Attending Obstetrician & Gynecologist, Faculty Practice Center for [employer], [facility]

## 2023-11-20 NOTE — Op Note (Signed)
 Virginia Pearson PROCEDURE DATE: 11/20/2023  PREOPERATIVE DIAGNOSIS: undesired fertility  POSTOPERATIVE DIAGNOSIS: undesired fertility PROCEDURE:    laparoscopic bilateral salpingectomy, excision of cesarean scar endometrioma and repair of cesarean scar defect SURGEON: Inell Mimbs, MD ASSISTANT:  Vina Solian, MD    An experienced assistant was required given the standard of surgical care given the complexity of the case.  This assistant was needed for exposure, dissection, suctioning, retraction, instrument exchange, and for overall help during the procedure.  INDICATIONS: 28 y.o. [id number] with undesired fertility.  Risks of surgery were discussed with the patient including but not limited to: bleeding which may require transfusion; infection which may require antibiotics; injury to surrounding organs; need for additional procedures including laparotomy;  and other postoperative/anesthesia complications. Written informed consent was obtained.    FINDINGS:  Normal external genitalia, normal appearing cervix Laparoscopically: normal upper abdominal survey, normal sized uterus with 2cm dark mass in lower uterine segment, normal bilateral  fallopian tubes, normal bilateral ovaries, bilateral ureters seen, otherwise normal anterior cul de sac, normal posterior cul de sac   ANESTHESIA: General INTRAVENOUS FLUIDS:  900 ml of LR ESTIMATED BLOOD LOSS:  20 ml URINE OUTPUT: 350 ml SPECIMENS:  bilateral fallopian tubes, cesarean scar endometrioma cyst COMPLICATIONS:  None immediate.   PROCEDURE:  The patient was taken to the operating room where general anesthesia was obtained without difficulty.  She was then placed in the dorsal lithotomy position and prepared and draped in sterile fashion.  After an adequate timeout was performed, a bivalved speculum was then placed in the patient's vagina, and the anterior lip of cervix grasped with the single-tooth tenaculum.  The speculum was removed  from the vagina.  Attention was then turned to the patient's abdomen where a 5-mm skin incision was made in the umbilical fold.  The 5-mm trocar and sleeve were then advanced without difficulty with the laparoscope under direct visualization into the abdomen.  The abdomen was then insufflated with carbon dioxide gas.  Adequate pneumoperitoneum was obtained.  A survey of the patient's pelvis and abdomen revealed the findings above.  Bilateral 5-mm lower quadrant ports  were then placed under direct visualization.  The fallopian tubes were transected from the uterine attachments and the underlying mesosalpinx with the Harmonic device allowing for bilateral salpingectomy.  The fallopian tubes were then removed from the abdomen under direct visualization.  The operative site was surveyed, and it was found to be hemostatic. At this time attention was turned to the dark mass in the lower uterine segment. The mass appeared to be an endometrioma. The peritoneum was sharply entered with harmonic scalpel. The cyst was bluntly and sharply dissected from its surrounding structures. The cyst was ruptured during manipulation and dark fluid was released. The cyst wall was removed in piece meal fashion. Once removed there appeared to be a defect in the scar. Decision made to proceed with repair of defect. An 8mm suprapubic port was placed and  2-0 vicryl was used to place a figure of 8 stitch to close the defect. Adequate hemostasis was achieved.  No intraoperative injury to other surrounding organs was noted. Arista applied to the operative site.  The abdomen was desufflated and all instruments were then removed from the patient's abdomen.  Skin incisions were closed with 4-0 vicryl. They were also covered with dermabond.  The tenaculum was removed from the vagina without complications. The patient tolerated the procedure well.  Sponge, lap, and needle counts were correct times two.  The patient  was then taken to the recovery  room awake, extubated and in stable condition.   Carter Quarry, MD Minimally Invasive Gynecologic Surgery  Obstetrics and Gynecology, [facility] for [facility], [facility] 11/20/2023

## 2023-11-20 NOTE — Discharge Instructions (Addendum)
 Post-surgical Instructions, Outpatient Surgery  You may expect to feel dizzy, weak, and drowsy for as long as 24 hours after receiving the medicine that made you sleep (anesthetic). For the first 24 hours after your surgery:   Do not drive a car, ride a bicycle, participate in physical activities, or take public transportation until you are done taking narcotic pain medicines or as directed by Dr. Jeralyn.  Do not drink alcohol or take tranquilizers.  Do not take medicine that has not been prescribed by your physicians.  Do not sign important papers or make important decisions while on narcotic pain medicines.  Have a responsible person with you.   CARE OF INCISION If you have a bandage, you may remove it in one day.  If there are steri-strips or dermabond, just let this loosen on its own.  You may shower on the first day after your surgery.  Do not sit in a tub bath for one week. Avoid heavy lifting (more than 10 pounds/4.5 kilograms), pushing, or pulling.  Avoid activities that may risk injury to your incisions.   PAIN MANAGEMENT Ibuprofen  800mg .  (This is the same as 4-200mg  over the counter tablets of Motrin  or ibuprofen .)  Take this every 6 hours or as needed for cramping.   Acetaminophen  1000mg  (This is the same as 2-500mg  over the counter extra strength tylenol ). Take this every 6 hours for the first 3 days or as needed afterwards for pain Oxycodone  5mg  For more severe pain, take one or two tablets every four to six hours as needed for pain control.  (Remember that narcotic pain medications increase your risk of constipation.  If this becomes a problem, you may take an over the counter laxative like miralax.)  DO'S AND DON'T'S Do not take a tub bath for 1 weeks.  You may shower on the first day after your surgery Do not do any heavy lifting for one to two weeks.  This increases the chance of bleeding. Do move around as you feel able.  Stairs are fine.  You may begin to exercise again as  you feel able.  Do not lift any weights for two weeks. Do not put anything in the vagina for two weeks--no tampons, intercourse, or douching.    REGULAR MEDIATIONS/VITAMINS: You may restart all of your regular medications as prescribed. You may restart all of your vitamins as you normally take them.    PLEASE CALL OR SEEK MEDICAL CARE IF: You have persistent nausea and vomiting.  You have trouble eating or drinking.  You have an oral temperature above 100.5.  You have constipation that is not helped by adjusting diet or increasing fluid intake. Pain medicines are a common cause of constipation.  You have heavy vaginal bleeding You have redness or drainage from your incision(s) or there is increasing pain or tenderness near or in the surgical site.    Post Anesthesia Home Care Instructions  Activity: Get plenty of rest for the remainder of the day. A responsible individual must stay with you for 24 hours following the procedure.  For the next 24 hours, DO NOT: -Drive a car -Advertising copywriter -Drink alcoholic beverages -Take any medication unless instructed by your physician -Make any legal decisions or sign important papers.  Meals: Start with liquid foods such as gelatin or soup. Progress to regular foods as tolerated. Avoid greasy, spicy, heavy foods. If nausea and/or vomiting occur, drink only clear liquids until the nausea and/or vomiting subsides. Call your physician if  vomiting continues.  Special Instructions/Symptoms: Your throat may feel dry or sore from the anesthesia or the breathing tube placed in your throat during surgery. If this causes discomfort, gargle with warm salt water. The discomfort should disappear within 24 hours.

## 2023-11-20 NOTE — Anesthesia Preprocedure Evaluation (Addendum)
 Anesthesia Evaluation  Patient identified by MRN, date of birth, ID band Patient awake    Reviewed: Allergy & Precautions, NPO status , Patient's Chart, lab work & pertinent test results  Airway Mallampati: II  TM Distance: >3 FB Neck ROM: Full    Dental no notable dental hx. (+) Teeth Intact, Dental Advisory Given   Pulmonary neg pulmonary ROS   Pulmonary exam normal breath sounds clear to auscultation       Cardiovascular hypertension, Normal cardiovascular exam Rhythm:Regular Rate:Normal     Neuro/Psych  Headaches  negative psych ROS   GI/Hepatic negative GI ROS, Neg liver ROS,,,  Endo/Other  diabetes  Class 3 obesity (BMI 48)  Renal/GU negative Renal ROS  negative genitourinary   Musculoskeletal negative musculoskeletal ROS (+)    Abdominal   Peds  Hematology  (+) Blood dyscrasia, Sickle cell trait   Anesthesia Other Findings   Reproductive/Obstetrics                              Anesthesia Physical Anesthesia Plan  ASA: 3  Anesthesia Plan: General   Post-op Pain Management: Minimal or no pain anticipated, Tylenol  PO (pre-op)*, Celebrex  PO (pre-op)* and Gabapentin  PO (pre-op)*   Induction: Intravenous  PONV Risk Score and Plan: 2 and Ondansetron   Airway Management Planned: Oral ETT and Simple Face Mask  Additional Equipment: None  Intra-op Plan:   Post-operative Plan: Extubation in OR  Informed Consent: I have reviewed the patients History and Physical, chart, labs and discussed the procedure including the risks, benefits and alternatives for the proposed anesthesia with the patient or authorized representative who has indicated his/her understanding and acceptance.     Dental advisory given  Plan Discussed with: CRNA and Anesthesiologist  Anesthesia Plan Comments: (  )         Anesthesia Quick Evaluation

## 2023-11-20 NOTE — Brief Op Note (Signed)
 11/20/2023  5:41 PM  PATIENT:  Virginia Pearson  28 y.o. female  PRE-OPERATIVE DIAGNOSIS:  Undesired fertility  POST-OPERATIVE DIAGNOSIS:  Undesired fertility  PROCEDURE:  Procedure(s): SALPINGECTOMY, BILATERAL, LAPAROSCOPIC (N/A) EXCISION, ENDOMETRIOSIS, LAPAROSCOPIC/REPAIR CESAREAN SECTION SCAR DEFECT  SURGEON:  Surgeons and Role:    DEWAINE Jeralyn Crutch, MD - Primary    * Cleatus Moccasin, MD  PHYSICIAN ASSISTANT: n/a  ASSISTANTS: Moccasin Cleatus, MD   ANESTHESIA:   local and general  EBL:  20 mL   BLOOD ADMINISTERED:none  DRAINS: none   LOCAL MEDICATIONS USED:  BUPIVICAINE   SPECIMEN:  Source of Specimen:  bilateral fallopian tubes, endometriosis cy st  DISPOSITION OF SPECIMEN:  PATHOLOGY  COUNTS:  YES  TOURNIQUET:  * No tourniquets in log *  DICTATION: .Note written in EPIC  PLAN OF CARE: Discharge to home after PACU  PATIENT DISPOSITION:  PACU - hemodynamically stable.   Delay start of Pharmacological VTE agent (>24hrs) due to surgical blood loss or risk of bleeding: not applicable

## 2023-11-20 NOTE — Anesthesia Procedure Notes (Signed)
 Procedure Name: Intubation Date/Time: 11/20/2023 3:53 PM  Performed by: Elby Raelene SAUNDERS, CRNAPre-anesthesia Checklist: Patient identified, Emergency Drugs available, Suction available and Patient being monitored Patient Re-evaluated:Patient Re-evaluated prior to induction Oxygen Delivery Method: Circle System Utilized Preoxygenation: Pre-oxygenation with 100% oxygen Induction Type: IV induction Ventilation: Mask ventilation without difficulty Laryngoscope Size: Miller and 2 Grade View: Grade II Tube type: Oral Tube size: 7.0 mm Number of attempts: 1 Airway Equipment and Method: Stylet and Bite block Placement Confirmation: ETT inserted through vocal cords under direct vision, positive ETCO2 and breath sounds checked- equal and bilateral Secured at: 22 cm Tube secured with: Tape Dental Injury: Teeth and Oropharynx as per pre-operative assessment

## 2023-11-21 ENCOUNTER — Encounter (HOSPITAL_COMMUNITY): Payer: Self-pay | Admitting: Obstetrics and Gynecology

## 2023-11-21 ENCOUNTER — Other Ambulatory Visit (HOSPITAL_COMMUNITY): Payer: Self-pay

## 2023-11-21 NOTE — Anesthesia Postprocedure Evaluation (Signed)
 Anesthesia Post Note  Patient: Virginia Pearson  Procedure(s) Performed: SALPINGECTOMY, BILATERAL, LAPAROSCOPIC (Pelvis) EXCISION, ENDOMETRIOSIS, LAPAROSCOPIC/REPAIR CESAREAN SECTION SCAR DEFECT (Pelvis)     Patient location during evaluation: PACU Anesthesia Type: General Level of consciousness: awake and alert Pain management: pain level controlled Vital Signs Assessment: post-procedure vital signs reviewed and stable Respiratory status: spontaneous breathing, nonlabored ventilation, respiratory function stable and patient connected to nasal cannula oxygen Cardiovascular status: blood pressure returned to baseline and stable Postop Assessment: no apparent nausea or vomiting Anesthetic complications: no   No notable events documented.  Last Vitals:  Vitals:   11/20/23 1945 11/20/23 2000  BP:  (!) 138/90  Pulse:  90  Resp:  (!) 21  Temp:    SpO2: 92% 95%    Last Pain:  Vitals:   11/20/23 1945  TempSrc:   PainSc: 0-No pain                 Thadd Apuzzo

## 2023-11-22 LAB — SURGICAL PATHOLOGY

## 2023-11-23 ENCOUNTER — Encounter (HOSPITAL_COMMUNITY): Payer: Self-pay | Admitting: Obstetrics and Gynecology

## 2023-11-23 NOTE — Transfer of Care (Signed)
 Immediate Anesthesia Transfer of Care Note  Patient: Virginia Pearson  Procedure(s) Performed: SALPINGECTOMY, BILATERAL, LAPAROSCOPIC (Pelvis) EXCISION, ENDOMETRIOSIS, LAPAROSCOPIC/REPAIR CESAREAN SECTION SCAR DEFECT (Pelvis)  Patient Location: PACU  Anesthesia Type:General  Level of Consciousness: awake and alert   Airway & Oxygen Therapy: Patient Spontanous Breathing  Post-op Assessment: Report given to RN  Post vital signs: Reviewed and stable  Last Vitals:  Vitals Value Taken Time  BP 138/90 11/20/23 20:00  Temp 36.8 C 11/20/23 17:42  Pulse 83 11/20/23 20:12  Resp 16 11/20/23 20:12  SpO2 92 % 11/20/23 20:12  Vitals shown include unfiled device data.  Last Pain:  Vitals:   11/20/23 1945  TempSrc:   PainSc: 0-No pain      Patients Stated Pain Goal: 4 (11/20/23 1033)  Complications: No notable events documented.

## 2023-11-26 ENCOUNTER — Telehealth: Payer: Self-pay | Admitting: Family Medicine

## 2023-11-26 NOTE — Telephone Encounter (Signed)
 Spanish/Patient is requesting a call back regarding post surgery questions

## 2023-11-27 ENCOUNTER — Ambulatory Visit: Payer: Self-pay | Admitting: Obstetrics and Gynecology

## 2023-11-27 NOTE — Telephone Encounter (Signed)
 Called pt with Ana, [employer], and pt stated that she needed an appt and the appt was already scheduled.   Confirmed with pt that her appt is scheduled on 12/17/23 for f/u.  Pt advised to contact the office with questions/concerns.  Pt verbalized understanding.   Demetrion Wesby,RN

## 2023-11-27 NOTE — Telephone Encounter (Signed)
 Called patient x2, no answer, left [employer] [phone]

## 2023-11-28 ENCOUNTER — Telehealth: Payer: Self-pay | Admitting: Obstetrics and Gynecology

## 2023-11-28 DIAGNOSIS — O029 Abnormal product of conception, unspecified: Secondary | ICD-10-CM

## 2023-11-28 NOTE — Telephone Encounter (Signed)
 Called patient and confirmed ID x 2. Reviewed pathology results. Reviwed that I had pathologist re-review slides and POC was confirmed. Unclear if POC from prior pregnancy in 2023 or if some other abnormal process. Will plan for quant bHCG - if positive, will trend and plan for surveillance, consider possibility of GTN and if negative, will plan for repeat US  to reassess uterine structure s/p repair.   FINAL MICROSCOPIC DIAGNOSIS:   A. FALLOPIAN TUBES, BILATERAL, SALPINGECTOMY:  Two benign fallopian tubes.   B. ENDOMETRIAL WALL, EXCISION:  Chorionic villi and trophoblastic tissue consistent with products of  conception.    Spanish interpreter used for encounter

## 2023-12-09 ENCOUNTER — Other Ambulatory Visit: Payer: Self-pay | Admitting: Obstetrics and Gynecology

## 2023-12-17 ENCOUNTER — Other Ambulatory Visit: Payer: Self-pay

## 2023-12-17 ENCOUNTER — Ambulatory Visit: Admitting: Obstetrics and Gynecology

## 2023-12-17 VITALS — BP 133/84 | HR 75 | Wt 246.9 lb

## 2023-12-17 DIAGNOSIS — Z3009 Encounter for other general counseling and advice on contraception: Secondary | ICD-10-CM

## 2023-12-17 DIAGNOSIS — Z09 Encounter for follow-up examination after completed treatment for conditions other than malignant neoplasm: Secondary | ICD-10-CM

## 2023-12-17 DIAGNOSIS — O029 Abnormal product of conception, unspecified: Secondary | ICD-10-CM | POA: Diagnosis not present

## 2023-12-17 DIAGNOSIS — Z758 Other problems related to medical facilities and other health care: Secondary | ICD-10-CM

## 2023-12-17 NOTE — Progress Notes (Signed)
   POSTOPERATIVE VISIT NOTE   Subjective:     Virginia Pearson is a 28 y.o. [phone] who presents to the clinic 4 weeks status post bilateral salpingectomy and excision of POC from cesarean scar. for requested sterilization. Eating a regular diet without difficulty. Bowel movements are normal. The patient is not having any pain. Incision: healing well, small suture coming from RLQ incision Vaginal bleeding: had several days of heavy  Resumed sexual acitivity: yes, with condoms as she is worried about getting pregnancy  Feels a sensation of something moving inside of her abdomen; feels a palpitation sensation on the side and it's always on the left. Had prior workup that ws negative  Another depo maybe in June. Had prolonged spotting after surgery but otherwise doing well. Wondering if her menses will be normal.   The following portions of the patient's history were reviewed and updated as appropriate: allergies, current medications, past family history, past medical history, past social history, past surgical history, and problem list..   Review of Systems Pertinent items are noted in HPI.    Objective:    BP 133/84   Pulse 75   Wt 246 lb 14.4 oz (112 kg)   BMI 48.22 kg/m  General:  alert, cooperative, and no distress  Abdomen: soft, non-tender  Incision:   healing well, no drainage, no erythema, no hernia, no seroma, no swelling, no dehiscence, incision well approximated  Pelvic:   Exam deferred.    Pathology Results: FINAL MICROSCOPIC DIAGNOSIS:   A. FALLOPIAN TUBES, BILATERAL, SALPINGECTOMY:  Two benign fallopian tubes.   B. ENDOMETRIAL WALL, EXCISION:  Chorionic villi and trophoblastic tissue consistent with products of  conception.    Assessment:   Doing well postoperatively. Operative findings again reviewed. Pathology report discussed.   Plan:    1. Postop check (Primary) Doing well. Suture pulled from incision, all incisions well approximated  2. Products  of conception, abnormal Reviewed unclear why/how POC within cesarean scar. Will get bHCG today and follow up therafter. If negative, no additional follow up and will monitor menses. Reviewed intraoperative photos and confirmation of pathology.  - Beta hCG quant (ref lab)  3. Language barrier affecting health care In person spanish interpreter used for encounter  4. Unwanted fertility S/p lsc salpingectomy   Activity restrictions: none Follow up: pending labs  Carter Quarry, MD Obstetrician & Gynecologist, [facility] for [employer], [facility]

## 2023-12-18 ENCOUNTER — Ambulatory Visit: Payer: Self-pay | Admitting: Obstetrics and Gynecology

## 2023-12-18 LAB — BETA HCG QUANT (REF LAB): hCG Quant: <1 m[IU]/mL

## 2023-12-19 NOTE — Telephone Encounter (Signed)
 Called Pt using [employer] Spanish Interpreter Leia id# [phone] to go over pregnancy hormone level & effects of being on Depo. Pt verbalized understanding.

## 2023-12-19 NOTE — Telephone Encounter (Signed)
-----   Message from Carter Quarry sent at 12/18/2023  5:14 PM EDT ----- Notify that pregnancy hormone level was negative. Menses may be abnormal as it transitions from being of depo. As mentioned in clinic, suspect leftover pregnancy tissue from prior pregnancy/cesarean  delivery  ----- Message ----- From: Interface, [facility] Lab Results In Sent: 12/18/2023   3:36 AM EDT To: Carter Quarry, MD

## 2024-02-06 ENCOUNTER — Other Ambulatory Visit: Payer: Self-pay

## 2024-02-06 ENCOUNTER — Encounter (HOSPITAL_COMMUNITY): Payer: Self-pay

## 2024-02-06 ENCOUNTER — Emergency Department (HOSPITAL_COMMUNITY)
Admission: EM | Admit: 2024-02-06 | Discharge: 2024-02-07 | Disposition: A | Discharge status: Home / Self Care | Attending: Emergency Medicine | Admitting: Emergency Medicine

## 2024-02-06 DIAGNOSIS — R1032 Left lower quadrant pain: Secondary | ICD-10-CM | POA: Diagnosis present

## 2024-02-06 DIAGNOSIS — Q631 Lobulated, fused and horseshoe kidney: Secondary | ICD-10-CM | POA: Diagnosis not present

## 2024-02-06 DIAGNOSIS — K429 Umbilical hernia without obstruction or gangrene: Secondary | ICD-10-CM | POA: Diagnosis not present

## 2024-02-06 DIAGNOSIS — N83201 Unspecified ovarian cyst, right side: Secondary | ICD-10-CM | POA: Insufficient documentation

## 2024-02-06 DIAGNOSIS — R109 Unspecified abdominal pain: Secondary | ICD-10-CM | POA: Diagnosis not present

## 2024-02-06 DIAGNOSIS — N83202 Unspecified ovarian cyst, left side: Secondary | ICD-10-CM | POA: Diagnosis not present

## 2024-02-06 DIAGNOSIS — N3000 Acute cystitis without hematuria: Secondary | ICD-10-CM | POA: Diagnosis not present

## 2024-02-06 DIAGNOSIS — K5730 Diverticulosis of large intestine without perforation or abscess without bleeding: Secondary | ICD-10-CM | POA: Diagnosis not present

## 2024-02-06 LAB — COMPREHENSIVE METABOLIC PANEL WITH GFR
ALT: 23 U/L (ref 0–44)
AST: 21 U/L (ref 15–41)
Albumin: 3.7 g/dL (ref 3.5–5.0)
Alkaline Phosphatase: 113 U/L (ref 38–126)
Anion gap: 10 (ref 5–15)
BUN: 7 mg/dL (ref 6–20)
CO2: 23 mmol/L (ref 22–32)
Calcium: 9.3 mg/dL (ref 8.9–10.3)
Chloride: 102 mmol/L (ref 98–111)
Creatinine, Ser: 0.57 mg/dL (ref 0.44–1.00)
GFR, Estimated: >60 mL/min (ref >60)
Glucose, Bld: 109 mg/dL — ABNORMAL HIGH (ref 70–99)
Potassium: 3.8 mmol/L (ref 3.5–5.1)
Sodium: 135 mmol/L (ref 135–145)
Total Bilirubin: <0.2 mg/dL (ref 0.0–1.2)
Total Protein: 7.3 g/dL (ref 6.5–8.1)

## 2024-02-06 LAB — CBC
HCT: 37.1 % (ref 36.0–46.0)
Hemoglobin: 12.1 g/dL (ref 12.0–15.0)
MCH: 24.6 pg — ABNORMAL LOW (ref 26.0–34.0)
MCHC: 32.6 g/dL (ref 30.0–36.0)
MCV: 75.6 fL — ABNORMAL LOW (ref 80.0–100.0)
Platelets: 373 K/uL (ref 150–400)
RBC: 4.91 MIL/uL (ref 3.87–5.11)
RDW: 15 % (ref 11.5–15.5)
WBC: 7.9 K/uL (ref 4.0–10.5)
nRBC: 0 % (ref 0.0–0.2)

## 2024-02-06 LAB — LIPASE, BLOOD: Lipase: 31 U/L (ref 11–51)

## 2024-02-06 LAB — HCG, SERUM, QUALITATIVE: Preg, Serum: NEGATIVE

## 2024-02-06 NOTE — ED Provider Triage Note (Signed)
 Emergency Medicine Provider Triage Evaluation Note  Virginia Pearson , a 28 y.o. female  was evaluated in triage.  Pt complains of losing hair. Left side abdominal pain x 2 weeks, intermittent, has tried Motrin  and Tylenol .  Tubaligation 11/20/23.   Review of Systems  Positive:  Negative:   Physical Exam  BP 134/75 (BP Location: Right Arm)   Pulse 76   Temp 98.7 F (37.1 C) (Oral)   Resp 16   Ht 5' (1.524 m)   Wt 108.9 kg   SpO2 98%   BMI 46.87 kg/m  Gen:   Awake, no distress   Resp:  Normal effort  MSK:   Moves extremities without difficulty  Other:    Medical Decision Making  Medically screening exam initiated at 6:26 PM.  Appropriate orders placed.  Virginia Pearson was informed that the remainder of the evaluation will be completed by another provider, this initial triage assessment does not replace that evaluation, and the importance of remaining in the ED until their evaluation is complete.     Beverley Leita LABOR, PA-C 02/06/24 [phone]

## 2024-02-06 NOTE — ED Triage Notes (Signed)
 Pt had a bilateral tubal ligation July the 15th and is now having left sided abd pain, throat pain when she raises her voice/talks a lot, losing hair, and nausea. Denies CP and SHOB.

## 2024-02-06 NOTE — ED Triage Notes (Signed)
 Pt arrives via POV. PT c/o intermittent abdominal pain and nausea for the past two weeks. Pt AxOx4

## 2024-02-07 ENCOUNTER — Other Ambulatory Visit: Payer: Self-pay

## 2024-02-07 ENCOUNTER — Emergency Department (HOSPITAL_COMMUNITY)

## 2024-02-07 DIAGNOSIS — K429 Umbilical hernia without obstruction or gangrene: Secondary | ICD-10-CM | POA: Diagnosis not present

## 2024-02-07 DIAGNOSIS — K5730 Diverticulosis of large intestine without perforation or abscess without bleeding: Secondary | ICD-10-CM | POA: Diagnosis not present

## 2024-02-07 DIAGNOSIS — R109 Unspecified abdominal pain: Secondary | ICD-10-CM | POA: Diagnosis not present

## 2024-02-07 DIAGNOSIS — Q631 Lobulated, fused and horseshoe kidney: Secondary | ICD-10-CM | POA: Diagnosis not present

## 2024-02-07 LAB — URINALYSIS, ROUTINE W REFLEX MICROSCOPIC
Bilirubin Urine: NEGATIVE
Glucose, UA: NEGATIVE mg/dL
Ketones, ur: NEGATIVE mg/dL
Leukocytes,Ua: NEGATIVE
Nitrite: NEGATIVE
Protein, ur: 30 mg/dL — AB
RBC / HPF: >50 RBC/hpf (ref 0–5)
Specific Gravity, Urine: 1.012 (ref 1.005–1.030)
pH: 7 (ref 5.0–8.0)

## 2024-02-07 MED ORDER — CEPHALEXIN 250 MG PO CAPS
500.0000 mg | ORAL_CAPSULE | Freq: Once | ORAL | Status: AC
  Administered 2024-02-07: 500 mg via ORAL
  Filled 2024-02-07: qty 2

## 2024-02-07 MED ORDER — CEPHALEXIN 500 MG PO CAPS
500.0000 mg | ORAL_CAPSULE | Freq: Four times a day (QID) | ORAL | 0 refills | Status: DC
  Filled 2024-02-07: qty 28, 7d supply, fill #0

## 2024-02-07 NOTE — ED Provider Notes (Signed)
 [facility] Pearson EMERGENCY DEPARTMENT AT [facility] Provider Note   CSN: [id number] Arrival date & time: 02/06/24  1749     Patient presents with: Abdominal Pain   Pearson Pearson is a 28 y.o. female.   The history is provided by the patient.  Abdominal Pain Pain location:  LLQ Pain quality: aching   Pain radiates to:  Does not radiate Pain severity:  Moderate Onset quality:  Gradual Duration:  2 days Timing:  Intermittent Progression:  Unchanged Chronicity:  New Context: not alcohol use   Relieved by:  Nothing Worsened by:  Nothing Ineffective treatments:  None tried Associated symptoms: no anorexia, no dysuria, no fever and no vaginal bleeding   Risk factors: has not had multiple surgeries   Also having chronic hair loss and has seen her PMD for this.       Prior to Admission medications   Medication Sig Start Date End Date Taking? Authorizing Provider  Acetaminophen  Extra Strength 500 MG TABS TAKE 1 TABLET BY MOUTH EVERY 6 HOURS AS NEEDED. Patient not taking: Reported on 12/17/2023 12/10/23   Ajewole, Christana, MD  ibuprofen  (ADVIL ) 600 MG tablet TAKE 1 TABLET BY MOUTH EVERY 6 HOURS AS NEEDED Patient not taking: Reported on 12/17/2023 12/10/23   Ajewole, Christana, MD  montelukast  (SINGULAIR ) 10 MG tablet Take 1 tablet (10 mg total) by mouth at bedtime. Patient not taking: Reported on 12/17/2023 09/27/23   Pearson Pearson , CNM  Multiple Vitamin (MULTIVITAMIN WITH MINERALS) TABS tablet Take 1 tablet by mouth daily. Patient not taking: Reported on 12/17/2023    [provider]  oxyCODONE  (OXY IR/ROXICODONE ) 5 MG immediate release tablet Take 1 tablet (5 mg total) by mouth every 4 (four) hours as needed for severe pain (pain score 7-10) or breakthrough pain. Patient not taking: Reported on 12/17/2023 11/20/23   Ajewole, Christana, MD    Allergies: Patient has no known allergies.    Review of Systems  Constitutional:  Negative for fever.   Gastrointestinal:  Positive for abdominal pain. Negative for anorexia.  Genitourinary:  Positive for frequency. Negative for dysuria and vaginal bleeding.  All other systems reviewed and are negative.   Updated Vital Signs BP (!) 137/95 (BP Location: Right Arm)   Pulse (!) 56   Temp 98 F (36.7 C) (Oral)   Resp 18   Ht 5' (1.524 m)   Wt 108.9 kg   SpO2 100%   BMI 46.87 kg/m   Physical Exam Vitals and nursing note reviewed.  Constitutional:      General: She is not in acute distress.    Appearance: Normal appearance. She is well-developed.  HENT:     Head: Normocephalic and atraumatic.     Mouth/Throat:     Mouth: Mucous membranes are moist.     Pharynx: Oropharynx is clear. No oropharyngeal exudate or posterior oropharyngeal erythema.  Eyes:     Pupils: Pupils are equal, round, and reactive to light.  Cardiovascular:     Rate and Rhythm: Normal rate and regular rhythm.     Pulses: Normal pulses.     Heart sounds: Normal heart sounds.  Pulmonary:     Effort: Pulmonary effort is normal. No respiratory distress.     Breath sounds: Normal breath sounds. No stridor. No wheezing or rhonchi.  Abdominal:     General: Bowel sounds are normal. There is no distension.     Palpations: Abdomen is rigid.     Tenderness: There is no abdominal tenderness. There is  no guarding or rebound.  Genitourinary:    Vagina: No vaginal discharge.  Musculoskeletal:        General: Normal range of motion.     Cervical back: Normal range of motion and neck supple.  Skin:    General: Skin is warm and dry.     Capillary Refill: Capillary refill takes less than 2 seconds.     Findings: No erythema or rash.  Neurological:     General: No focal deficit present.     Mental Status: She is alert.     Deep Tendon Reflexes: Reflexes normal.  Psychiatric:        Mood and Affect: Mood normal.     (all labs ordered are listed, but only abnormal results are displayed) Results for orders placed or  performed during the hospital encounter of 02/06/24  Lipase, blood   Collection Time: 02/06/24  6:20 PM  Result Value Ref Range   Lipase 31 11 - 51 U/L  Comprehensive metabolic panel   Collection Time: 02/06/24  6:20 PM  Result Value Ref Range   Sodium 135 135 - 145 mmol/L   Potassium 3.8 3.5 - 5.1 mmol/L   Chloride 102 98 - 111 mmol/L   CO2 23 22 - 32 mmol/L   Glucose, Bld 109 (H) 70 - 99 mg/dL   BUN 7 6 - 20 mg/dL   Creatinine, Ser 9.42 0.44 - 1.00 mg/dL   Calcium 9.3 8.9 - 89.6 mg/dL   Total Protein 7.3 6.5 - 8.1 g/dL   Albumin 3.7 3.5 - 5.0 g/dL   AST 21 15 - 41 U/L   ALT 23 0 - 44 U/L   Alkaline Phosphatase 113 38 - 126 U/L   Total Bilirubin <0.2 0.0 - 1.2 mg/dL   GFR, Estimated >39 >39 mL/min   Anion gap 10 5 - 15  CBC   Collection Time: 02/06/24  6:20 PM  Result Value Ref Range   WBC 7.9 4.0 - 10.5 K/uL   RBC 4.91 3.87 - 5.11 MIL/uL   Hemoglobin 12.1 12.0 - 15.0 g/dL   HCT 62.8 63.9 - 53.9 %   MCV 75.6 (L) 80.0 - 100.0 fL   MCH 24.6 (L) 26.0 - 34.0 pg   MCHC 32.6 30.0 - 36.0 g/dL   RDW 84.9 88.4 - 84.4 %   Platelets 373 150 - 400 K/uL   nRBC 0.0 0.0 - 0.2 %  hCG, serum, qualitative   Collection Time: 02/06/24  6:20 PM  Result Value Ref Range   Preg, Serum NEGATIVE NEGATIVE  Urinalysis, Routine w reflex microscopic -Urine, Clean Catch   Collection Time: 02/07/24  1:12 AM  Result Value Ref Range   Color, Urine PINK (A) YELLOW   APPearance HAZY (A) CLEAR   Specific Gravity, Urine 1.012 1.005 - 1.030   pH 7.0 5.0 - 8.0   Glucose, UA NEGATIVE NEGATIVE mg/dL   Hgb urine dipstick LARGE (A) NEGATIVE   Bilirubin Urine NEGATIVE NEGATIVE   Ketones, ur NEGATIVE NEGATIVE mg/dL   Protein, ur 30 (A) NEGATIVE mg/dL   Nitrite NEGATIVE NEGATIVE   Leukocytes,Ua NEGATIVE NEGATIVE   RBC / HPF >50 0 - 5 RBC/hpf   WBC, UA 6-10 0 - 5 WBC/hpf   Bacteria, UA MANY (A) NONE SEEN   Squamous Epithelial / HPF 0-5 0 - 5 /HPF   Mucus PRESENT    CT Renal Stone Study Result Date:  02/07/2024 CLINICAL DATA:  Abdominal and flank pain. Stone suspected. Intermittent abdominal pain  and nausea recurrent x2 weeks. EXAM: CT ABDOMEN AND PELVIS WITHOUT CONTRAST TECHNIQUE: Multidetector CT imaging of the abdomen and pelvis was performed following the standard protocol without IV contrast. RADIATION DOSE REDUCTION: This exam was performed according to the departmental dose-optimization program which includes automated exposure control, adjustment of the mA and/or kV according to patient size and/or use of iterative reconstruction technique. COMPARISON:  CT with IV contrast 03/04/2023. FINDINGS: Lower chest: No abnormality. Hepatobiliary: Liver is 22 cm in length and mildly steatotic. No focal abnormality is seen without contrast. The gallbladder and bile ducts are unremarkable. Pancreas: Normal in size, configuration and noncontrast attenuation. No contour deforming abnormality. Spleen: Normal in size and noncontrast attenuation. Adrenals/Urinary Tract: No adrenal mass. There are no contour deforming mass of kidneys, with horseshoe kidney variant again noted. There is no urinary stone or obstruction. The bladder is unremarkable. Stomach/Bowel: No dilatation or wall thickening. Normal caliber appendix. Scattered sigmoid diverticula without diverticulitis or colitis. Vascular/Lymphatic: No significant vascular findings are present. No enlarged abdominal or pelvic lymph nodes. Reproductive: 3.9 cm homogeneous thin walled cyst right L3, Hounsfield density is 15.3. No follow-up imaging is recommended unless there is concern for ovarian torsion. Left ovary is unremarkable. Normal in size. Unremarkable uterus. Other: Small umbilical fat hernia. No incarcerated hernia. No free fluid, free air, free hemorrhage or focal inflammatory process is seen. Musculoskeletal: No acute or significant osseous findings. IMPRESSION: 1. No acute noncontrast CT abnormality. 2. Horseshoe kidney variant. No urinary stone or  obstruction. 3. 3.9 cm thin walled homogeneous cyst right ovary. No follow-up imaging is recommended unless there is concern for ovarian torsion. 4. Mildly prominent liver with mild steatosis. 5. Small umbilical fat hernia. Electronically Signed   By: Francis Quam M.D.   On: 02/07/2024 02:18     Radiology: CT Renal Stone Study Result Date: 02/07/2024 CLINICAL DATA:  Abdominal and flank pain. Stone suspected. Intermittent abdominal pain and nausea recurrent x2 weeks. EXAM: CT ABDOMEN AND PELVIS WITHOUT CONTRAST TECHNIQUE: Multidetector CT imaging of the abdomen and pelvis was performed following the standard protocol without IV contrast. RADIATION DOSE REDUCTION: This exam was performed according to the departmental dose-optimization program which includes automated exposure control, adjustment of the mA and/or kV according to patient size and/or use of iterative reconstruction technique. COMPARISON:  CT with IV contrast 03/04/2023. FINDINGS: Lower chest: No abnormality. Hepatobiliary: Liver is 22 cm in length and mildly steatotic. No focal abnormality is seen without contrast. The gallbladder and bile ducts are unremarkable. Pancreas: Normal in size, configuration and noncontrast attenuation. No contour deforming abnormality. Spleen: Normal in size and noncontrast attenuation. Adrenals/Urinary Tract: No adrenal mass. There are no contour deforming mass of kidneys, with horseshoe kidney variant again noted. There is no urinary stone or obstruction. The bladder is unremarkable. Stomach/Bowel: No dilatation or wall thickening. Normal caliber appendix. Scattered sigmoid diverticula without diverticulitis or colitis. Vascular/Lymphatic: No significant vascular findings are present. No enlarged abdominal or pelvic lymph nodes. Reproductive: 3.9 cm homogeneous thin walled cyst right L3, Hounsfield density is 15.3. No follow-up imaging is recommended unless there is concern for ovarian torsion. Left ovary is  unremarkable. Normal in size. Unremarkable uterus. Other: Small umbilical fat hernia. No incarcerated hernia. No free fluid, free air, free hemorrhage or focal inflammatory process is seen. Musculoskeletal: No acute or significant osseous findings. IMPRESSION: 1. No acute noncontrast CT abnormality. 2. Horseshoe kidney variant. No urinary stone or obstruction. 3. 3.9 cm thin walled homogeneous cyst right ovary. No follow-up imaging is  recommended unless there is concern for ovarian torsion. 4. Mildly prominent liver with mild steatosis. 5. Small umbilical fat hernia. Electronically Signed   By: Francis Quam M.D.   On: 02/07/2024 02:18     Procedures   Medications Ordered in the ED  cephALEXin  (KEFLEX ) capsule 500 mg (500 mg Oral Given 02/07/24 0144)                                    Medical Decision Making Patient with LLQ pain and frequency and ongoing hari loss.   Amount and/or Complexity of Data Reviewed External Data Reviewed: notes.    Details: Previous records reviewed  Labs: ordered.    Details: Pregnancy is negative urine is consistent with UTI. Lipase is normal 31. Normal white count 7.9, normal hemoglobin 12.1, normal sodium 135, normal potassium 3.8, normal creatinine  Radiology: ordered and independent interpretation performed.    Details: No stones   Risk Prescription drug management. Risk Details: Not concerned for torsion, as pain is on the left and not on the right where the cyst is on CT.  Patient has a UTI.  I will treat for same.  Ongoing hair loss is an issue for PMD and not the emergency department.  Stable for discharge with close follow up.  Strict returns.      Final diagnoses:  Acute cystitis without hematuria   No signs of systemic illness or infection. The patient is nontoxic-appearing on exam and vital signs are within normal limits.  I have reviewed the triage vital signs and the nursing notes. Pertinent labs & imaging results that were available  during my care of the patient were reviewed by me and considered in my medical decision making (see chart for details). After history, exam, and medical workup I feel the patient has been appropriately medically screened and is safe for discharge home. Pertinent diagnoses were discussed with the patient. Patient was given return precautions.      ED Discharge Orders     None          Katilin Raynes, MD 02/07/24 [phone]

## 2024-03-19 ENCOUNTER — Encounter: Payer: Self-pay | Admitting: Family Medicine

## 2024-03-19 ENCOUNTER — Ambulatory Visit (INDEPENDENT_AMBULATORY_CARE_PROVIDER_SITE_OTHER): Payer: Self-pay | Admitting: Family Medicine

## 2024-03-19 VITALS — BP 126/84 | HR 77 | Wt 244.4 lb

## 2024-03-19 DIAGNOSIS — E119 Type 2 diabetes mellitus without complications: Secondary | ICD-10-CM | POA: Diagnosis not present

## 2024-03-19 DIAGNOSIS — L659 Nonscarring hair loss, unspecified: Secondary | ICD-10-CM | POA: Diagnosis not present

## 2024-03-19 DIAGNOSIS — Z7984 Long term (current) use of oral hypoglycemic drugs: Secondary | ICD-10-CM | POA: Diagnosis not present

## 2024-03-19 LAB — POCT GLYCOSYLATED HEMOGLOBIN (HGB A1C): Hemoglobin A1C: 6.5 % — AB (ref 4.0–5.6)

## 2024-03-19 MED ORDER — METFORMIN HCL 1000 MG PO TABS: 1000.0000 mg | ORAL_TABLET | Freq: Two times a day (BID) | ORAL | 1 refills | Status: AC

## 2024-03-19 NOTE — Progress Notes (Signed)
 Name: Virginia Pearson   Date of Visit: 03/19/24   Date of last visit with me: Visit date not found   CHIEF COMPLAINT:  Chief Complaint  Patient presents with   Establish Care    New patient. No questions or concerns.        HPI:  Discussed the use of AI scribe software for clinical note transcription with the patient, who gave verbal consent to proceed.  History of Present Illness   Virginia Pearson is a 28 year old female with diabetes who presents with hair loss.  She has been experiencing hair loss for the past two to three months, with the development of a bald patch. She denies recent childbirth, as her daughters are two years old. Additionally, she has noticed new growths resembling moles with long hairs, which she finds concerning.  She has a history of diabetes that began during her pregnancy. She is not currently taking any medication for diabetes, and her recent A1c level was 6.5. She wants to lose weight.  She is starting a new job that begins at 6 AM and inquires about the timing of medication intake, as she does not typically eat breakfast early. She plans to take her medication during her lunch break.  No current sore throat, although she experienced it recently.         OBJECTIVE:       12/17/2023    4:06 PM  Depression screen PHQ 2/9  Decreased Interest 0  Down, Depressed, Hopeless 0  PHQ - 2 Score 0  Altered sleeping 0  Tired, decreased energy 0  Change in appetite 0  Feeling bad or failure about yourself  0  Trouble concentrating 0  Moving slowly or fidgety/restless 0  Suicidal thoughts 0  PHQ-9 Score 0      Data saved with a previous flowsheet row definition     BP Readings from Last 3 Encounters:  03/19/24 126/84  02/07/24 (!) 147/83  12/17/23 133/84    BP 126/84   Pulse 77   Wt 244 lb 6.4 oz (110.9 kg)   SpO2 98%   BMI 47.73 kg/m    Physical Exam          Physical Exam Constitutional:      Appearance: Normal  appearance. She is obese.  Cardiovascular:     Rate and Rhythm: Normal rate and regular rhythm.     Pulses: Normal pulses.     Heart sounds: Normal heart sounds.  Skin:    Findings: Lesion present.  Neurological:     General: No focal deficit present.     Mental Status: She is alert and oriented to person, place, and time. Mental status is at baseline.     ASSESSMENT/PLAN:   Assessment & Plan Diabetes mellitus treated with oral medication (HCC)  Hair loss    Assessment and Plan    Type 2 diabetes mellitus A1c of 6.5% indicates poor glycemic control, contributing to hair loss and weight management issues. - Started metformin  1000 mg twice daily with food. - Ordered blood work to assess thyroid  function and iron  levels. - Scheduled follow-up in 3 months for diabetes management and A1c reassessment. - Suspect GLP-1 agonist may be beneficial if they cannot tolerate metformin  or do no improve from medication.   Nonscarring hair loss Likely secondary to uncontrolled diabetes and possible hormonal or nutritional deficiencies. - Ordered blood work to evaluate thyroid  function and iron  levels. - Address diabetes management to potentially improve  hair loss.         Virginia Pearson A. Vita MD [facility]

## 2024-03-20 ENCOUNTER — Ambulatory Visit: Payer: Self-pay | Admitting: Family Medicine

## 2024-03-20 LAB — IRON,TIBC AND FERRITIN PANEL
Ferritin: 82 ng/mL (ref 15–150)
Iron Saturation: 18 % (ref 15–55)
Iron: 74 ug/dL (ref 27–159)
Total Iron Binding Capacity: 405 ug/dL (ref 250–450)
UIBC: 331 ug/dL (ref 131–425)

## 2024-03-20 LAB — THYROID CASCADE PROFILE: TSH: 1.43 u[IU]/mL (ref 0.450–4.500)

## 2024-06-26 ENCOUNTER — Ambulatory Visit: Admitting: Family Medicine
